# Patient Record
Sex: Male | Born: 1950
Health system: Southern US, Community
[De-identification: ages and names within clinical notes are randomized; demographics above are authoritative.]

## PROBLEM LIST (undated history)

## (undated) DIAGNOSIS — K5090 Crohn's disease, unspecified, without complications: Secondary | ICD-10-CM

## (undated) HISTORY — PX: OTHER SURGICAL HISTORY: SHX169

---

## 2000-01-07 ENCOUNTER — Ambulatory Visit (HOSPITAL_COMMUNITY): Admission: RE | Admit: 2000-01-07 | Discharge: 2000-01-07 | Payer: Self-pay | Admitting: Gastroenterology

## 2000-01-07 ENCOUNTER — Encounter (INDEPENDENT_AMBULATORY_CARE_PROVIDER_SITE_OTHER): Payer: Self-pay | Admitting: Specialist

## 2000-02-04 ENCOUNTER — Encounter (HOSPITAL_COMMUNITY): Admission: RE | Admit: 2000-02-04 | Discharge: 2000-05-04 | Payer: Self-pay | Admitting: Gastroenterology

## 2007-06-05 ENCOUNTER — Ambulatory Visit: Payer: Self-pay | Admitting: Sports Medicine

## 2007-06-05 DIAGNOSIS — S93529A Sprain of metatarsophalangeal joint of unspecified toe(s), initial encounter: Secondary | ICD-10-CM | POA: Insufficient documentation

## 2007-06-05 DIAGNOSIS — M2010 Hallux valgus (acquired), unspecified foot: Secondary | ICD-10-CM | POA: Insufficient documentation

## 2007-06-05 DIAGNOSIS — M76899 Other specified enthesopathies of unspecified lower limb, excluding foot: Secondary | ICD-10-CM | POA: Insufficient documentation

## 2007-06-26 ENCOUNTER — Ambulatory Visit: Payer: Self-pay | Admitting: Sports Medicine

## 2007-06-26 DIAGNOSIS — M2240 Chondromalacia patellae, unspecified knee: Secondary | ICD-10-CM | POA: Insufficient documentation

## 2007-08-08 ENCOUNTER — Ambulatory Visit: Payer: Self-pay | Admitting: Sports Medicine

## 2007-09-12 ENCOUNTER — Ambulatory Visit: Payer: Self-pay | Admitting: Sports Medicine

## 2007-09-12 DIAGNOSIS — B353 Tinea pedis: Secondary | ICD-10-CM

## 2007-09-12 DIAGNOSIS — M659 Synovitis and tenosynovitis, unspecified: Secondary | ICD-10-CM

## 2010-12-03 NOTE — Procedures (Signed)
[facility]  Patient:    Kenneth Cruz, Kenneth Cruz                    MRN: [id number] Proc. Date: 01/07/00 Adm. Date:  [id number] Disc. Date: [id number] Attending:  Louie Bun                           Procedure Report  PROCEDURE:  Colonoscopy.  INDICATIONS FOR PROCEDURE:  Crohns colitis versus ulcerative colitis with failure to improve with prednisone and Rowasa enemas.  The procedure is to reevaluate the extent and severity of disease in the colon and terminal ileum.  DESCRIPTION OF PROCEDURE:  The patient was placed in the left lateral decubitus  position and placed on the pulse monitor with continuous low flow oxygen delivered by nasal cannula.  He was sedated with 15 mg of IV Demerol and 2.5 mg of IV Versed in addition to the 50 mg of Demerol and 5 mg of Versed given for the previous EGD. The Olympus video colonoscope was inserted into the rectum and advanced to the cecum, confirmed by transillumination of McBurneys point and visualization of the ileocecal valve and appendiceal orifice.  The prep was good.  The cecum appeared normal.  The terminal ileum was intubated and explored for several cm and appeared to be normal with the exception of some possible exception of some mild granularity and edema.  Biopsies were taken there.  As mentioned, the cecum appeared normal, as did the ascending and transverse colon.  Within the descending colon beginning t about 50 cm and extending to about 30 cm, there was a confluent colitis characterized by edema, erythema, friability, granularity and some small aphthous ulcers as well as obliteration of the normal vascular pattern.  No pseudopolyps  were seen and no deep ulcers or fissures.  The appearance was consistent with either Crohns or ulcerative colitis.  Biopsies were taken of the most abnormal appearing areas.  There was a transition to normal mucosa from about 30 cm to 20 cm and, below this  point, the mucosa appeared basically normal.  The rectum appeared visibly spared of any inflammation, although there was some hyperemia in the distal rectum.  Biopsies were taken of what appeared to be normal rectal mucosa and sent in a separate specimen container.  There was no evidence of any perianal fistulas or any other evidence of perianal disease.  The colonoscope was then withdrawn nd the patient returned to the recovery room in stable condition.  He tolerated the procedure well and there were no immediate complications.  IMPRESSION:  Colitis, primarily in the descending and sigmoid colon, moderately  intense in appearance.  PLAN:  Await biopsy results from all areas to help tailor therapy and will consider possible use of second-line agents such as Remicade or . DD:  01/07/00 TD:  01/10/00 Job: [id number]

## 2010-12-03 NOTE — Procedures (Signed)
[facility]  Patient:    Kenneth Cruz, Kenneth Cruz                    MRN: [id number] Proc. Date: 01/07/00 Adm. Date:  [id number] Disc. Date: [id number] Attending:  Louie Bun                           Procedure Report  PROCEDURE:  Esophagogastroduodenoscopy with biopsy.  INDICATION FOR PROCEDURE:  History of Crohns colitis fairly refractory to standard medicine with some atypical features. The patient is set up for a colonoscopy to reevaluate the extent and intensity of inflammatory changes of the colon and terminal ileum and we decided to proceed with EGD to rule out any possible secondary sources of diarrhea such as celiac disease.  DESCRIPTION OF PROCEDURE:  The patient was placed in the left lateral decubitus position then placed on the pulse monitor with continue low flow oxygen delivered by nasal cannula. He was sedated with 50 mg IV Demerol and 5 mg IV Versed. The Olympus video endoscope was advanced under direct vision into the oropharynx and esophagus. The esophagus was straight and of normal caliber with the squamocolumnar line at 38 cm. There was no visible hiatal hernia or ring, stricture or other abnormality of the GE junction. The stomach was entered and a small amount of liquid secretions were suctioned from the fundus. Retroflexed view of the cardia was unremarkable. The fundus, body, antrum and pylorus all appeared normal. The duodenum was entered and both the bulb and second portion were well inspected and appeared to be within normal limits. The scope was advanced as far as possible down the distal duodenum and biopsies obtained. The scope was then withdrawn and the patient returned to the recovery room in stable condition. The patient tolerated the procedure well and there were no immediate complications.  IMPRESSION:  Essentially normal endoscopy.  PLAN: 1. Await biopsy results to rule out a sprue-like lesion of the duodenum. 2.  Proceed with colonoscopy. DD:  01/07/00 TD:  01/10/00 Job: [id number]

## 2011-05-27 ENCOUNTER — Emergency Department (HOSPITAL_COMMUNITY)
Admission: EM | Admit: 2011-05-27 | Discharge: 2011-05-27 | Disposition: A | Discharge status: Home / Self Care | Payer: 59 | Attending: Emergency Medicine | Admitting: Emergency Medicine

## 2011-05-27 ENCOUNTER — Encounter: Payer: Self-pay | Admitting: Emergency Medicine

## 2011-05-27 ENCOUNTER — Emergency Department (HOSPITAL_COMMUNITY): Payer: 59

## 2011-05-27 DIAGNOSIS — M549 Dorsalgia, unspecified: Secondary | ICD-10-CM | POA: Insufficient documentation

## 2011-05-27 DIAGNOSIS — N200 Calculus of kidney: Secondary | ICD-10-CM

## 2011-05-27 DIAGNOSIS — R109 Unspecified abdominal pain: Secondary | ICD-10-CM | POA: Insufficient documentation

## 2011-05-27 DIAGNOSIS — Z8719 Personal history of other diseases of the digestive system: Secondary | ICD-10-CM | POA: Insufficient documentation

## 2011-05-27 DIAGNOSIS — N201 Calculus of ureter: Secondary | ICD-10-CM | POA: Insufficient documentation

## 2011-05-27 HISTORY — DX: Crohn's disease, unspecified, without complications: K50.90

## 2011-05-27 LAB — URINE MICROSCOPIC-ADD ON

## 2011-05-27 LAB — COMPREHENSIVE METABOLIC PANEL
ALT: 23 U/L (ref 0–53)
AST: 38 U/L — ABNORMAL HIGH (ref 0–37)
Calcium: 9.8 mg/dL (ref 8.4–10.5)
Creatinine, Ser: 1.26 mg/dL (ref 0.50–1.35)
GFR calc non Af Amer: 60 mL/min — ABNORMAL LOW (ref >90)
Sodium: 138 mEq/L (ref 135–145)
Total Protein: 7.3 g/dL (ref 6.0–8.3)

## 2011-05-27 LAB — URINALYSIS, ROUTINE W REFLEX MICROSCOPIC
Glucose, UA: NEGATIVE mg/dL
Leukocytes, UA: NEGATIVE
Protein, ur: NEGATIVE mg/dL
Specific Gravity, Urine: 1.024 (ref 1.005–1.030)
Urobilinogen, UA: 0.2 mg/dL (ref 0.0–1.0)

## 2011-05-27 LAB — DIFFERENTIAL
Basophils Relative: 0 % (ref 0–1)
Eosinophils Absolute: 0.1 10*3/uL (ref 0.0–0.7)
Monocytes Relative: 12 % (ref 3–12)
Neutrophils Relative %: 50 % (ref 43–77)

## 2011-05-27 LAB — CBC
MCH: 31.1 pg (ref 26.0–34.0)
MCHC: 33.2 g/dL (ref 30.0–36.0)
Platelets: 225 10*3/uL (ref 150–400)

## 2011-05-27 MED ORDER — TAMSULOSIN HCL 0.4 MG PO CAPS: 0.4000 mg | ORAL_CAPSULE | Freq: Every day | ORAL | Status: AC

## 2011-05-27 MED ORDER — HYDROMORPHONE HCL PF 1 MG/ML IJ SOLN
1.0000 mg | Freq: Once | INTRAMUSCULAR | Status: AC
  Administered 2011-05-27: 1 mg via INTRAVENOUS
  Filled 2011-05-27: qty 1

## 2011-05-27 MED ORDER — OXYCODONE-ACETAMINOPHEN 5-325 MG PO TABS: 2.0000 | ORAL_TABLET | ORAL | Status: AC | PRN

## 2011-05-27 MED ORDER — HYDROMORPHONE HCL PF 1 MG/ML IJ SOLN
0.5000 mg | Freq: Once | INTRAMUSCULAR | Status: AC
  Administered 2011-05-27: 0.5 mg via INTRAVENOUS
  Filled 2011-05-27: qty 1

## 2011-05-27 MED ORDER — ONDANSETRON HCL 4 MG/2ML IJ SOLN
4.0000 mg | Freq: Once | INTRAMUSCULAR | Status: AC
  Administered 2011-05-27: 4 mg via INTRAVENOUS
  Filled 2011-05-27: qty 2

## 2011-05-27 MED ORDER — IOHEXOL 300 MG/ML  SOLN
100.0000 mL | Freq: Once | INTRAMUSCULAR | Status: AC | PRN
  Administered 2011-05-27: 100 mL via INTRAVENOUS

## 2011-05-27 NOTE — ED Notes (Signed)
Pt states he woke with pain in his left lower quadrant about 4am  Pt states the pain radiates down into his groin area  Pt denies N/V/D

## 2011-05-27 NOTE — ED Notes (Signed)
Notified MD for pain meds for patient.  Started IV in anticipation of orders.  Labs drawn from IV.  Urine obtained.

## 2011-05-27 NOTE — ED Notes (Signed)
Care of pt assumed. Pt resting in bed. Reports pain is 1/10 at this time. Denies n/v. Pt updated on plan of care and plan for CT. Pt verbalizes understanding and agreement. Will continue to monitor.

## 2011-05-27 NOTE — ED Notes (Signed)
Pt resting. CT contrast finished. Will inform CT. Pt voices no needs/concerns at this time. Will continue to monitor.

## 2011-05-27 NOTE — ED Provider Notes (Signed)
Medical screening examination/treatment/procedure(s) were performed by non-physician practitioner and as supervising physician I was immediately available for consultation/collaboration.  Olivia Mackie, MD 05/27/11 [phone]

## 2011-05-27 NOTE — ED Notes (Signed)
Pt awakened with severe pain.  Pt unable to urinate.  No hx of kidney stone.  Pain in llq of abdomen. Sharp and constant in nature. Pt grimacing with pain.

## 2011-05-27 NOTE — ED Provider Notes (Signed)
History     CSN: [id number] Arrival date & time: 05/27/2011  5:11 AM   First MD Initiated Contact with Patient 05/27/11 [phone]      Chief Complaint  Patient presents with  . Abdominal Pain    (Consider location/radiation/quality/duration/timing/severity/associated sxs/prior treatment) Patient is a 60 y.o. male presenting with abdominal pain. The history is provided by the patient. No language interpreter was used.  Abdominal Pain The primary symptoms of the illness include abdominal pain and dysuria. The primary symptoms of the illness do not include fever, shortness of breath, nausea, vomiting, diarrhea or hematochezia. The current episode started 3 to 5 hours ago. The onset of the illness was sudden. The problem has not changed since onset. Additional symptoms associated with the illness include back pain.   Pt w/ hx of UC (in remission for 5 yrs) is presenting with acute onset of LLQ abd pain since this AM. Pain is sharp, constant, not improving or relieved with anything.  He did notice difficulty urinating this morning. He did have a normal bowel movement this morning.  Pain is now radiating to his left flank. He denies fever, headache, nausea, vomiting, diarrhea, chest pain, shortness of breath, or blood per stool.  Patient states pain does not feels like his ulcerative colitis. Patient denies history of kidney stones. Patient denies history of abdominal hernia. Patient denies rash. He is an avid workout buff and workout every single day. Denies increased strenuous exercising his usual baseline. Past Medical History  Diagnosis Date  . Ulcerative colitis   . Crohn's disease     Past Surgical History  Procedure Date  . Arthroscopic knee     Family History  Problem Relation Age of Onset  . Diabetes Father   . Diabetes Brother     History  Substance Use Topics  . Smoking status: Never Smoker   . Smokeless tobacco: Not on file  . Alcohol Use: No      Review of Systems    Constitutional: Negative for fever.  Respiratory: Negative for shortness of breath.   Gastrointestinal: Positive for abdominal pain. Negative for nausea, vomiting, diarrhea and hematochezia.  Genitourinary: Positive for dysuria.  Musculoskeletal: Positive for back pain.  All other systems reviewed and are negative.    Allergies  Review of patient's allergies indicates no known allergies.  Home Medications   Current Outpatient Rx  Name Route Sig Dispense Refill  . ZOLPIDEM TARTRATE 5 MG PO TABS Oral Take 5 mg by mouth at bedtime as needed.        BP 147/91  Pulse 50  Temp(Src) 98.7 F (37.1 C) (Oral)  Resp 18  SpO2 100%  Physical Exam  Constitutional: He appears well-developed and well-nourished.  HENT:  Head: Normocephalic and atraumatic.  Eyes: Conjunctivae and EOM are normal.  Neck: Normal range of motion. Neck supple.  Cardiovascular: Normal rate, regular rhythm and normal heart sounds.  Exam reveals no gallop and no friction rub.   No murmur heard. Pulmonary/Chest: Effort normal and breath sounds normal. No respiratory distress. He has no wheezes.       L CVA tenderness  Abdominal: Soft. He exhibits no distension. There is no tenderness. There is CVA tenderness. There is no rebound and no guarding. No hernia. Hernia confirmed negative in the right inguinal area and confirmed negative in the left inguinal area.       LLQ nontender  Genitourinary: Penis normal. Guaiac negative stool. No penile tenderness.       No  hernia present  Musculoskeletal: Normal range of motion.  Skin: Skin is warm and dry. No rash noted. He is not diaphoretic.    ED Course  Procedures (including critical care time)   Labs Reviewed  CBC  DIFFERENTIAL  COMPREHENSIVE METABOLIC PANEL  LIPASE, BLOOD  URINALYSIS, ROUTINE W REFLEX MICROSCOPIC   No results found. Labs Reviewed  COMPREHENSIVE METABOLIC PANEL - Abnormal; Notable for the following:    Glucose, Bld 127 (*)    AST 38 (*)     GFR calc non Af Amer 60 (*)    GFR calc Af Amer 70 (*)    All other components within normal limits  URINALYSIS, ROUTINE W REFLEX MICROSCOPIC - Abnormal; Notable for the following:    Hgb urine dipstick SMALL (*)    All other components within normal limits  URINE MICROSCOPIC-ADD ON - Abnormal; Notable for the following:    Bacteria, UA FEW (*)    All other components within normal limits  CBC  DIFFERENTIAL  LIPASE, BLOOD  OCCULT BLOOD, POC DEVICE   Results for orders placed during the hospital encounter of 05/27/11  CBC      Component Value Range   WBC 6.6  4.0 - 10.5 (K/uL)   RBC 4.86  4.22 - 5.81 (MIL/uL)   Hemoglobin 15.1  13.0 - 17.0 (g/dL)   HCT 16.1  09.6 - 04.5 (%)   MCV 93.6  78.0 - 100.0 (fL)   MCH 31.1  26.0 - 34.0 (pg)   MCHC 33.2  30.0 - 36.0 (g/dL)   RDW 40.9  81.1 - 91.4 (%)   Platelets 225  150 - 400 (K/uL)  DIFFERENTIAL      Component Value Range   Neutrophils Relative 50  43 - 77 (%)   Neutro Abs 3.3  1.7 - 7.7 (K/uL)   Lymphocytes Relative 36  12 - 46 (%)   Lymphs Abs 2.4  0.7 - 4.0 (K/uL)   Monocytes Relative 12  3 - 12 (%)   Monocytes Absolute 0.8  0.1 - 1.0 (K/uL)   Eosinophils Relative 2  0 - 5 (%)   Eosinophils Absolute 0.1  0.0 - 0.7 (K/uL)   Basophils Relative 0  0 - 1 (%)   Basophils Absolute 0.0  0.0 - 0.1 (K/uL)  COMPREHENSIVE METABOLIC PANEL      Component Value Range   Sodium 138  135 - 145 (mEq/L)   Potassium 3.8  3.5 - 5.1 (mEq/L)   Chloride 100  96 - 112 (mEq/L)   CO2 29  19 - 32 (mEq/L)   Glucose, Bld 127 (*) 70 - 99 (mg/dL)   BUN 22  6 - 23 (mg/dL)   Creatinine, Ser 7.82  0.50 - 1.35 (mg/dL)   Calcium 9.8  8.4 - 95.6 (mg/dL)   Total Protein 7.3  6.0 - 8.3 (g/dL)   Albumin 3.8  3.5 - 5.2 (g/dL)   AST 38 (*) 0 - 37 (U/L)   ALT 23  0 - 53 (U/L)   Alkaline Phosphatase 85  39 - 117 (U/L)   Total Bilirubin 0.5  0.3 - 1.2 (mg/dL)   GFR calc non Af Amer 60 (*) >90 (mL/min)   GFR calc Af Amer 70 (*) >90 (mL/min)  LIPASE, BLOOD       Component Value Range   Lipase 13  11 - 59 (U/L)  URINALYSIS, ROUTINE W REFLEX MICROSCOPIC      Component Value Range   Color, Urine YELLOW  YELLOW  Appearance CLEAR  CLEAR    Specific Gravity, Urine 1.024  1.005 - 1.030    pH 5.5  5.0 - 8.0    Glucose, UA NEGATIVE  NEGATIVE (mg/dL)   Hgb urine dipstick SMALL (*) NEGATIVE    Bilirubin Urine NEGATIVE  NEGATIVE    Ketones, ur NEGATIVE  NEGATIVE (mg/dL)   Protein, ur NEGATIVE  NEGATIVE (mg/dL)   Urobilinogen, UA 0.2  0.0 - 1.0 (mg/dL)   Nitrite NEGATIVE  NEGATIVE    Leukocytes, UA NEGATIVE  NEGATIVE   URINE MICROSCOPIC-ADD ON      Component Value Range   RBC / HPF 0-2  <3 (RBC/hpf)   Bacteria, UA FEW (*) RARE    Urine-Other MUCOUS PRESENT    OCCULT BLOOD, POC DEVICE      Component Value Range   Fecal Occult Bld NEGATIVE     Ct Abdomen Pelvis W Contrast  05/27/2011  *RADIOLOGY REPORT*  Clinical Data: Left lower quadrant pain.  Left flank pain.  CT ABDOMEN AND PELVIS WITH CONTRAST  Technique:  Multidetector CT imaging of the abdomen and pelvis was performed following the standard protocol during bolus administration of intravenous contrast.  Contrast: OMNIPAQUE IOHEXOL 300 MG/ML IV SOLN  Comparison: None.  Findings: 5.4 mm distal left ureteral (just proximal to the left ureteral vesicle junction) obstructing stone with moderate left- sided hydronephrosis with delayed function left kidney.  Tiny nonobstructing left lower pole renal calculi.  Mild atelectasis lung bases.  Prominence of the inferior vena cava and iliac/femoral veins may be related to increased right heart pressure versus Valsalva performed during imaging.  No focal liver, splenic, pancreatic, renal or adrenal lesion.  No abdominal aortic aneurysm.  Slightly prominent prostate gland and seminal vesicles.  Clinical and laboratory correlation recommended.  Noncontrast filled views of the urinary bladder unremarkable.  Scattered colonic diverticula without inflammation.  No  free fluid or free intraperitoneal air.  No calcified gallstones.  Cecum extends towards the superior aspect of the inguinal canal. No obstruction.  No bony destructive lesion.  There is a change in caliber of the stomach at the distal body level which may be related to fold/peristalsis rather than mass.  Scattered normal sized mesenteric lymph nodes.  IMPRESSION: 5.4 mm distal left ureteral (just proximal to the left ureteral vesicle junction) obstructing stone with moderate left-sided hydronephrosis with delayed function left kidney.  Tiny nonobstructing left lower pole renal calculi.  Please see above.  Original Report Authenticated By: Fuller Canada, M.D.      No diagnosis found.  6:37 AM Patient's present symptoms is suggestive of kidney stone. Obtain UA for further evaluation. If UA is normal, I will consider doing an abdominal and pelvis CT scan with contrast.  MDM  6:59 AM UA only shows minimal RBC.  Therefore, an abdominal and pelvic CT scan with contrast to be ordered for further evaluation.  Patient's pain is improved with Dilaudid. His vital signs are stable. His post and the 46s but suspects is due to his active lifestyle, and staying fit.     10:57 AM Abdominal CT scan today shows a 5.4 mm stone in the left UVJ with hydronephrosis. We'll prescribe pain medication and Flomax and also followup with a urologist. The patient agreed with plan. His vital signs stable. He is afebrile and his UA shows no sign of infection.         Fayrene Helper, PA 05/27/11 1105

## 2013-09-02 ENCOUNTER — Other Ambulatory Visit: Payer: Self-pay | Admitting: Orthopedic Surgery

## 2013-09-02 DIAGNOSIS — M545 Low back pain, unspecified: Secondary | ICD-10-CM

## 2013-09-07 ENCOUNTER — Other Ambulatory Visit: Payer: 59

## 2013-12-06 ENCOUNTER — Emergency Department (HOSPITAL_COMMUNITY)
Admission: EM | Admit: 2013-12-06 | Discharge: 2013-12-06 | Disposition: A | Discharge status: Home / Self Care | Payer: 59 | Source: Home / Self Care | Attending: Emergency Medicine | Admitting: Emergency Medicine

## 2013-12-06 ENCOUNTER — Encounter (HOSPITAL_COMMUNITY): Payer: Self-pay | Admitting: Emergency Medicine

## 2013-12-06 DIAGNOSIS — Z862 Personal history of diseases of the blood and blood-forming organs and certain disorders involving the immune mechanism: Secondary | ICD-10-CM

## 2013-12-06 DIAGNOSIS — Z8639 Personal history of other endocrine, nutritional and metabolic disease: Secondary | ICD-10-CM

## 2013-12-06 DIAGNOSIS — M791 Myalgia, unspecified site: Secondary | ICD-10-CM

## 2013-12-06 DIAGNOSIS — IMO0001 Reserved for inherently not codable concepts without codable children: Secondary | ICD-10-CM

## 2013-12-06 LAB — POCT I-STAT, CHEM 8
BUN: 24 mg/dL — ABNORMAL HIGH (ref 6–23)
CHLORIDE: 100 meq/L (ref 96–112)
Calcium, Ion: 1.23 mmol/L (ref 1.13–1.30)
Creatinine, Ser: 1.2 mg/dL (ref 0.50–1.35)
GLUCOSE: 93 mg/dL (ref 70–99)
HEMATOCRIT: 48 % (ref 39.0–52.0)
Hemoglobin: 16.3 g/dL (ref 13.0–17.0)
POTASSIUM: 4.3 meq/L (ref 3.7–5.3)
Sodium: 141 mEq/L (ref 137–147)
TCO2: 28 mmol/L (ref 0–100)

## 2013-12-06 NOTE — Discharge Instructions (Signed)
° °  Nice to meet you today. Hopefully the achiness will resolve, if worsens of further complaints please follow up with [place] or your PCP. Take care.

## 2013-12-06 NOTE — ED Notes (Signed)
Pt reports he rec'd call from PCP yest eve in re: to hight potasium; 5.5 Was told to come in to an Urgent care and have it checked; poss ekg Alert w/no signs of acute distress.

## 2013-12-06 NOTE — ED Provider Notes (Signed)
CSN: [id number]     Arrival date & time 12/06/13  1614 History   First MD Initiated Contact with Patient 12/06/13 1658     Chief Complaint  Patient presents with  . Labs Only    high potassium   (Consider location/radiation/quality/duration/timing/severity/associated sxs/prior Treatment) HPI Comments: Patient presents under the advice of a Physician at [facility] to have his Potassium levels checked. On 05/15 his K was reported at 5.5. He got the message today and came for a recheck because he has been having achiness in both arms. He has also been taking several new supplements. Earlier today he ran and lifted weights without any symptoms at all. He denies chest pain, SOB, or weakness.   The history is provided by the patient.    Past Medical History  Diagnosis Date  . Ulcerative colitis   . Crohn's disease    Past Surgical History  Procedure Laterality Date  . Arthroscopic knee     Family History  Problem Relation Age of Onset  . Diabetes Father   . Diabetes Brother    History  Substance Use Topics  . Smoking status: Never Smoker   . Smokeless tobacco: Not on file  . Alcohol Use: No    Review of Systems  Constitutional: Negative for chills and fatigue.  Respiratory: Negative.   Cardiovascular: Negative.   Endocrine: Negative.   Musculoskeletal: Positive for myalgias.  Skin: Negative.   Neurological: Negative.   Psychiatric/Behavioral: Negative.     Allergies  Review of patient's allergies indicates no known allergies.  Home Medications   Prior to Admission medications   Medication Sig Start Date End Date Taking? Authorizing Provider  Tamsulosin HCl (FLOMAX) 0.4 MG CAPS Take 1 capsule (0.4 mg total) by mouth daily after breakfast. 05/27/11   Benny Lennert, MD  zolpidem (AMBIEN) 5 MG tablet Take 5 mg by mouth at bedtime as needed.      Historical Provider, MD   BP 142/77  Pulse 98  Temp(Src) 98.4 F (36.9 C) (Oral)  Resp 18  SpO2 99% Physical  Exam  Nursing note and vitals reviewed. Constitutional: He is oriented to person, place, and time. He appears well-developed and well-nourished. No distress.  HENT:  Head: Normocephalic and atraumatic.  Neck: Normal range of motion.  Cardiovascular: Normal rate, regular rhythm and normal heart sounds.   No murmur heard. Pulmonary/Chest: Effort normal and breath sounds normal. No respiratory distress. He has no wheezes. He has no rales.  Musculoskeletal: Normal range of motion.  Full painless rom in the neck and shoulders without reproduction of pain. Motor 5/5 in bilateral upper extremities  Neurological: He is alert and oriented to person, place, and time. No cranial nerve deficit. He exhibits normal muscle tone. Coordination normal.  Skin: Skin is warm and dry. He is not diaphoretic.  Psychiatric: His behavior is normal.    ED Course  EKG  Date/Time: 12/06/2013 5:58 PM Performed by: Riki Sheer Authorized by: Riki Sheer Interpreted by ED physician Comments: Over read by Dr. Lorenz Coaster. Bradycardia in setting of past runner and baseline. Early repolarization o/w normal reading.     (including critical care time) Labs Review Labs Reviewed  POCT I-STAT, CHEM 8 - Abnormal; Notable for the following:    BUN 24 (*)    All other components within normal limits    Imaging Review No results found.   MDM   1. H/O hyperkalemia   2. Myalgia    Resolved. Asymptomatic o/t mild  achiness in the shoulders-EKG normal. On several natural supplements. Suggest holding to see if helps the achiness. No signs of weakness or findings in M/S exam. Reassurance given.     Riki SheerMichelle G Ciani Rutten, PA-C 12/06/13 1756  Riki SheerMichelle G Darnelle Derrick, PA-C 12/06/13 1759

## 2013-12-08 NOTE — ED Provider Notes (Signed)
Medical screening examination/treatment/procedure(s) were performed by non-physician practitioner and as supervising physician I was immediately available for consultation/collaboration.  Leslee Home, M.D.  Reuben Likes, MD 12/08/13 [phone]

## 2014-12-11 ENCOUNTER — Ambulatory Visit (INDEPENDENT_AMBULATORY_CARE_PROVIDER_SITE_OTHER): Payer: 59 | Admitting: Podiatry

## 2014-12-11 ENCOUNTER — Ambulatory Visit (INDEPENDENT_AMBULATORY_CARE_PROVIDER_SITE_OTHER): Payer: 59

## 2014-12-11 DIAGNOSIS — M779 Enthesopathy, unspecified: Secondary | ICD-10-CM

## 2014-12-11 NOTE — Progress Notes (Signed)
   Subjective:    Patient ID: Kenneth Cruz, male    DOB: Oct 27, 1950, 64 y.o.   MRN: [id number]  HPI  Pt presents with left foot pain between 2&3 met on the plantar side, he states that the pain worsens when "pushing off" to walk and with prolonged walking, pain has worsened over past year  Review of Systems  All other systems reviewed and are negative.      Objective:   Physical Exam: I have reviewed his past mental history medications allergies surgery social history and review of systems. Pulses are palpable bilateral. Neurologic sensorium is intact for Semmes-Weinstein monofilament. Deep tendon reflexes are intact bilaterally muscle strength is 5 over 5 dorsiflexion plantar plantar flexors and inverters and everters. Orthopedic evaluation demonstrates pain on range of motion and palpation of the third metatarsophalangeal joint of the left foot. He has a mild bulbous click to the third interdigital space that is not tender on palpation. Radiograph confirms relatively normal osseous architecture.        Assessment & Plan:  Assessment: Capsulitis third metatarsophalangeal joint left foot also concerned for a neuroma in this area.  Plan: Injected around the joint today third metatarsophalangeal joint left. Discussed appropriate shoe gear stretching exercises ice therapies your modifications.

## 2015-01-01 ENCOUNTER — Ambulatory Visit: Payer: 59 | Admitting: Podiatry

## 2016-02-22 DIAGNOSIS — J301 Allergic rhinitis due to pollen: Secondary | ICD-10-CM | POA: Diagnosis not present

## 2016-02-22 DIAGNOSIS — J3089 Other allergic rhinitis: Secondary | ICD-10-CM | POA: Diagnosis not present

## 2016-02-26 DIAGNOSIS — M5116 Intervertebral disc disorders with radiculopathy, lumbar region: Secondary | ICD-10-CM | POA: Diagnosis not present

## 2016-02-26 DIAGNOSIS — M545 Low back pain: Secondary | ICD-10-CM | POA: Diagnosis not present

## 2016-02-29 DIAGNOSIS — J3089 Other allergic rhinitis: Secondary | ICD-10-CM | POA: Diagnosis not present

## 2016-02-29 DIAGNOSIS — J301 Allergic rhinitis due to pollen: Secondary | ICD-10-CM | POA: Diagnosis not present

## 2016-03-07 DIAGNOSIS — J301 Allergic rhinitis due to pollen: Secondary | ICD-10-CM | POA: Diagnosis not present

## 2016-03-07 DIAGNOSIS — J3089 Other allergic rhinitis: Secondary | ICD-10-CM | POA: Diagnosis not present

## 2016-03-14 DIAGNOSIS — J3089 Other allergic rhinitis: Secondary | ICD-10-CM | POA: Diagnosis not present

## 2016-03-14 DIAGNOSIS — J301 Allergic rhinitis due to pollen: Secondary | ICD-10-CM | POA: Diagnosis not present

## 2016-03-22 DIAGNOSIS — J3089 Other allergic rhinitis: Secondary | ICD-10-CM | POA: Diagnosis not present

## 2016-03-22 DIAGNOSIS — J301 Allergic rhinitis due to pollen: Secondary | ICD-10-CM | POA: Diagnosis not present

## 2016-03-28 DIAGNOSIS — J301 Allergic rhinitis due to pollen: Secondary | ICD-10-CM | POA: Diagnosis not present

## 2016-03-28 DIAGNOSIS — J3089 Other allergic rhinitis: Secondary | ICD-10-CM | POA: Diagnosis not present

## 2016-04-05 DIAGNOSIS — J301 Allergic rhinitis due to pollen: Secondary | ICD-10-CM | POA: Diagnosis not present

## 2016-04-05 DIAGNOSIS — J3089 Other allergic rhinitis: Secondary | ICD-10-CM | POA: Diagnosis not present

## 2016-04-11 DIAGNOSIS — J301 Allergic rhinitis due to pollen: Secondary | ICD-10-CM | POA: Diagnosis not present

## 2016-04-11 DIAGNOSIS — J3089 Other allergic rhinitis: Secondary | ICD-10-CM | POA: Diagnosis not present

## 2016-04-14 DIAGNOSIS — J301 Allergic rhinitis due to pollen: Secondary | ICD-10-CM | POA: Diagnosis not present

## 2016-04-14 DIAGNOSIS — J3081 Allergic rhinitis due to animal (cat) (dog) hair and dander: Secondary | ICD-10-CM | POA: Diagnosis not present

## 2016-04-14 DIAGNOSIS — J3089 Other allergic rhinitis: Secondary | ICD-10-CM | POA: Diagnosis not present

## 2016-04-18 DIAGNOSIS — J301 Allergic rhinitis due to pollen: Secondary | ICD-10-CM | POA: Diagnosis not present

## 2016-04-18 DIAGNOSIS — J3089 Other allergic rhinitis: Secondary | ICD-10-CM | POA: Diagnosis not present

## 2016-04-25 DIAGNOSIS — J301 Allergic rhinitis due to pollen: Secondary | ICD-10-CM | POA: Diagnosis not present

## 2016-04-25 DIAGNOSIS — J3089 Other allergic rhinitis: Secondary | ICD-10-CM | POA: Diagnosis not present

## 2016-04-26 ENCOUNTER — Encounter: Payer: Self-pay | Admitting: Podiatry

## 2016-04-26 ENCOUNTER — Ambulatory Visit (INDEPENDENT_AMBULATORY_CARE_PROVIDER_SITE_OTHER): Payer: Medicare Other | Admitting: Podiatry

## 2016-04-26 DIAGNOSIS — M779 Enthesopathy, unspecified: Secondary | ICD-10-CM

## 2016-04-27 NOTE — Progress Notes (Signed)
He presents today after having injured his back. Severe. States that he still has that neuroma capsulitis type symptoms and the left which seems to have been worsened by his back. He states the foot still bothers me to some degree and he would like to see by getting some insoles or his athletic shoes.  Objective: Vital signs are stable he is alert and oriented 3. Pulses are palpable. Rectus foot type with some tenderness on palpation of the second and third metatarsophalangeal joints with no pain on palpation of the neuroma today. No open lesions or wounds. Symptoms appear to be muted compared to previous evaluation.  Assessment: Forefoot symptoms with a history of plantar fasciitis and back pain.  Plan: He was scanned today percent of orthotics.

## 2016-05-02 DIAGNOSIS — J301 Allergic rhinitis due to pollen: Secondary | ICD-10-CM | POA: Diagnosis not present

## 2016-05-02 DIAGNOSIS — J3089 Other allergic rhinitis: Secondary | ICD-10-CM | POA: Diagnosis not present

## 2016-05-09 DIAGNOSIS — J301 Allergic rhinitis due to pollen: Secondary | ICD-10-CM | POA: Diagnosis not present

## 2016-05-09 DIAGNOSIS — J3089 Other allergic rhinitis: Secondary | ICD-10-CM | POA: Diagnosis not present

## 2016-05-11 DIAGNOSIS — J301 Allergic rhinitis due to pollen: Secondary | ICD-10-CM | POA: Diagnosis not present

## 2016-05-11 DIAGNOSIS — J3089 Other allergic rhinitis: Secondary | ICD-10-CM | POA: Diagnosis not present

## 2016-05-16 DIAGNOSIS — J3089 Other allergic rhinitis: Secondary | ICD-10-CM | POA: Diagnosis not present

## 2016-05-16 DIAGNOSIS — J301 Allergic rhinitis due to pollen: Secondary | ICD-10-CM | POA: Diagnosis not present

## 2016-05-17 ENCOUNTER — Ambulatory Visit: Payer: 59

## 2016-05-17 DIAGNOSIS — M779 Enthesopathy, unspecified: Secondary | ICD-10-CM

## 2016-05-17 NOTE — Patient Instructions (Signed)

## 2016-05-17 NOTE — Progress Notes (Signed)
Patient presents for orthotic pick up.  Verbal and written break in and wear instructions given.  Patient will follow up in 4 weeks if symptoms worsen or fail to improve. 

## 2016-05-18 DIAGNOSIS — M7742 Metatarsalgia, left foot: Secondary | ICD-10-CM | POA: Diagnosis not present

## 2016-05-18 DIAGNOSIS — M21612 Bunion of left foot: Secondary | ICD-10-CM | POA: Diagnosis not present

## 2016-05-18 DIAGNOSIS — M2042 Other hammer toe(s) (acquired), left foot: Secondary | ICD-10-CM | POA: Diagnosis not present

## 2016-05-19 DIAGNOSIS — J3089 Other allergic rhinitis: Secondary | ICD-10-CM | POA: Diagnosis not present

## 2016-05-19 DIAGNOSIS — J301 Allergic rhinitis due to pollen: Secondary | ICD-10-CM | POA: Diagnosis not present

## 2016-05-23 DIAGNOSIS — J301 Allergic rhinitis due to pollen: Secondary | ICD-10-CM | POA: Diagnosis not present

## 2016-05-23 DIAGNOSIS — J3089 Other allergic rhinitis: Secondary | ICD-10-CM | POA: Diagnosis not present

## 2016-06-02 DIAGNOSIS — J3089 Other allergic rhinitis: Secondary | ICD-10-CM | POA: Diagnosis not present

## 2016-06-02 DIAGNOSIS — J301 Allergic rhinitis due to pollen: Secondary | ICD-10-CM | POA: Diagnosis not present

## 2016-06-06 DIAGNOSIS — J3089 Other allergic rhinitis: Secondary | ICD-10-CM | POA: Diagnosis not present

## 2016-06-06 DIAGNOSIS — J301 Allergic rhinitis due to pollen: Secondary | ICD-10-CM | POA: Diagnosis not present

## 2016-06-13 DIAGNOSIS — J3089 Other allergic rhinitis: Secondary | ICD-10-CM | POA: Diagnosis not present

## 2016-06-13 DIAGNOSIS — J3081 Allergic rhinitis due to animal (cat) (dog) hair and dander: Secondary | ICD-10-CM | POA: Diagnosis not present

## 2016-06-13 DIAGNOSIS — J301 Allergic rhinitis due to pollen: Secondary | ICD-10-CM | POA: Diagnosis not present

## 2016-06-17 DIAGNOSIS — Z86010 Personal history of colonic polyps: Secondary | ICD-10-CM | POA: Diagnosis not present

## 2016-06-20 DIAGNOSIS — J3089 Other allergic rhinitis: Secondary | ICD-10-CM | POA: Diagnosis not present

## 2016-06-20 DIAGNOSIS — J301 Allergic rhinitis due to pollen: Secondary | ICD-10-CM | POA: Diagnosis not present

## 2016-06-21 DIAGNOSIS — M2042 Other hammer toe(s) (acquired), left foot: Secondary | ICD-10-CM | POA: Diagnosis not present

## 2016-06-21 DIAGNOSIS — M2012 Hallux valgus (acquired), left foot: Secondary | ICD-10-CM | POA: Diagnosis not present

## 2016-06-21 DIAGNOSIS — M7742 Metatarsalgia, left foot: Secondary | ICD-10-CM | POA: Diagnosis not present

## 2016-06-21 DIAGNOSIS — G8918 Other acute postprocedural pain: Secondary | ICD-10-CM | POA: Diagnosis not present

## 2016-06-21 DIAGNOSIS — M21612 Bunion of left foot: Secondary | ICD-10-CM | POA: Diagnosis not present

## 2016-06-26 DIAGNOSIS — B029 Zoster without complications: Secondary | ICD-10-CM | POA: Diagnosis not present

## 2016-06-27 DIAGNOSIS — J3089 Other allergic rhinitis: Secondary | ICD-10-CM | POA: Diagnosis not present

## 2016-06-27 DIAGNOSIS — J301 Allergic rhinitis due to pollen: Secondary | ICD-10-CM | POA: Diagnosis not present

## 2016-06-30 DIAGNOSIS — N200 Calculus of kidney: Secondary | ICD-10-CM | POA: Diagnosis not present

## 2016-06-30 DIAGNOSIS — B029 Zoster without complications: Secondary | ICD-10-CM | POA: Diagnosis not present

## 2016-06-30 DIAGNOSIS — H9201 Otalgia, right ear: Secondary | ICD-10-CM | POA: Diagnosis not present

## 2016-06-30 DIAGNOSIS — R1030 Lower abdominal pain, unspecified: Secondary | ICD-10-CM | POA: Diagnosis not present

## 2016-07-04 DIAGNOSIS — J3089 Other allergic rhinitis: Secondary | ICD-10-CM | POA: Diagnosis not present

## 2016-07-04 DIAGNOSIS — Z4789 Encounter for other orthopedic aftercare: Secondary | ICD-10-CM | POA: Diagnosis not present

## 2016-07-04 DIAGNOSIS — J301 Allergic rhinitis due to pollen: Secondary | ICD-10-CM | POA: Diagnosis not present

## 2016-07-12 DIAGNOSIS — J301 Allergic rhinitis due to pollen: Secondary | ICD-10-CM | POA: Diagnosis not present

## 2016-07-12 DIAGNOSIS — J3089 Other allergic rhinitis: Secondary | ICD-10-CM | POA: Diagnosis not present

## 2016-07-15 DIAGNOSIS — H524 Presbyopia: Secondary | ICD-10-CM | POA: Diagnosis not present

## 2016-07-15 DIAGNOSIS — H2513 Age-related nuclear cataract, bilateral: Secondary | ICD-10-CM | POA: Diagnosis not present

## 2016-07-19 DIAGNOSIS — J3089 Other allergic rhinitis: Secondary | ICD-10-CM | POA: Diagnosis not present

## 2016-07-19 DIAGNOSIS — J301 Allergic rhinitis due to pollen: Secondary | ICD-10-CM | POA: Diagnosis not present

## 2016-07-25 DIAGNOSIS — J301 Allergic rhinitis due to pollen: Secondary | ICD-10-CM | POA: Diagnosis not present

## 2016-07-25 DIAGNOSIS — J3089 Other allergic rhinitis: Secondary | ICD-10-CM | POA: Diagnosis not present

## 2016-07-27 DIAGNOSIS — Z0000 Encounter for general adult medical examination without abnormal findings: Secondary | ICD-10-CM | POA: Diagnosis not present

## 2016-07-27 DIAGNOSIS — I10 Essential (primary) hypertension: Secondary | ICD-10-CM | POA: Diagnosis not present

## 2016-07-27 DIAGNOSIS — Z125 Encounter for screening for malignant neoplasm of prostate: Secondary | ICD-10-CM | POA: Diagnosis not present

## 2016-07-27 DIAGNOSIS — G4700 Insomnia, unspecified: Secondary | ICD-10-CM | POA: Diagnosis not present

## 2016-08-05 DIAGNOSIS — M21612 Bunion of left foot: Secondary | ICD-10-CM | POA: Diagnosis not present

## 2016-08-05 DIAGNOSIS — J301 Allergic rhinitis due to pollen: Secondary | ICD-10-CM | POA: Diagnosis not present

## 2016-08-05 DIAGNOSIS — J3089 Other allergic rhinitis: Secondary | ICD-10-CM | POA: Diagnosis not present

## 2016-08-05 DIAGNOSIS — Z4789 Encounter for other orthopedic aftercare: Secondary | ICD-10-CM | POA: Diagnosis not present

## 2016-08-08 DIAGNOSIS — Z23 Encounter for immunization: Secondary | ICD-10-CM | POA: Diagnosis not present

## 2016-08-08 DIAGNOSIS — Z0000 Encounter for general adult medical examination without abnormal findings: Secondary | ICD-10-CM | POA: Diagnosis not present

## 2016-08-08 DIAGNOSIS — I10 Essential (primary) hypertension: Secondary | ICD-10-CM | POA: Diagnosis not present

## 2016-08-11 DIAGNOSIS — J3089 Other allergic rhinitis: Secondary | ICD-10-CM | POA: Diagnosis not present

## 2016-08-11 DIAGNOSIS — J301 Allergic rhinitis due to pollen: Secondary | ICD-10-CM | POA: Diagnosis not present

## 2016-08-15 DIAGNOSIS — J3089 Other allergic rhinitis: Secondary | ICD-10-CM | POA: Diagnosis not present

## 2016-08-15 DIAGNOSIS — J301 Allergic rhinitis due to pollen: Secondary | ICD-10-CM | POA: Diagnosis not present

## 2016-08-22 DIAGNOSIS — J3089 Other allergic rhinitis: Secondary | ICD-10-CM | POA: Diagnosis not present

## 2016-08-22 DIAGNOSIS — J301 Allergic rhinitis due to pollen: Secondary | ICD-10-CM | POA: Diagnosis not present

## 2016-08-23 DIAGNOSIS — J301 Allergic rhinitis due to pollen: Secondary | ICD-10-CM | POA: Diagnosis not present

## 2016-08-23 DIAGNOSIS — J3089 Other allergic rhinitis: Secondary | ICD-10-CM | POA: Diagnosis not present

## 2016-08-29 DIAGNOSIS — J301 Allergic rhinitis due to pollen: Secondary | ICD-10-CM | POA: Diagnosis not present

## 2016-08-29 DIAGNOSIS — J3089 Other allergic rhinitis: Secondary | ICD-10-CM | POA: Diagnosis not present

## 2016-09-05 DIAGNOSIS — J301 Allergic rhinitis due to pollen: Secondary | ICD-10-CM | POA: Diagnosis not present

## 2016-09-05 DIAGNOSIS — J3089 Other allergic rhinitis: Secondary | ICD-10-CM | POA: Diagnosis not present

## 2016-09-09 DIAGNOSIS — M21612 Bunion of left foot: Secondary | ICD-10-CM | POA: Diagnosis not present

## 2016-09-09 DIAGNOSIS — Z4789 Encounter for other orthopedic aftercare: Secondary | ICD-10-CM | POA: Diagnosis not present

## 2016-09-12 DIAGNOSIS — J3089 Other allergic rhinitis: Secondary | ICD-10-CM | POA: Diagnosis not present

## 2016-09-12 DIAGNOSIS — J301 Allergic rhinitis due to pollen: Secondary | ICD-10-CM | POA: Diagnosis not present

## 2016-09-19 DIAGNOSIS — J3089 Other allergic rhinitis: Secondary | ICD-10-CM | POA: Diagnosis not present

## 2016-09-19 DIAGNOSIS — J301 Allergic rhinitis due to pollen: Secondary | ICD-10-CM | POA: Diagnosis not present

## 2016-09-27 DIAGNOSIS — J3089 Other allergic rhinitis: Secondary | ICD-10-CM | POA: Diagnosis not present

## 2016-09-27 DIAGNOSIS — J301 Allergic rhinitis due to pollen: Secondary | ICD-10-CM | POA: Diagnosis not present

## 2016-09-30 DIAGNOSIS — H903 Sensorineural hearing loss, bilateral: Secondary | ICD-10-CM | POA: Diagnosis not present

## 2016-10-03 DIAGNOSIS — J3089 Other allergic rhinitis: Secondary | ICD-10-CM | POA: Diagnosis not present

## 2016-10-03 DIAGNOSIS — J301 Allergic rhinitis due to pollen: Secondary | ICD-10-CM | POA: Diagnosis not present

## 2016-10-06 DIAGNOSIS — J3089 Other allergic rhinitis: Secondary | ICD-10-CM | POA: Diagnosis not present

## 2016-10-06 DIAGNOSIS — J301 Allergic rhinitis due to pollen: Secondary | ICD-10-CM | POA: Diagnosis not present

## 2016-10-10 DIAGNOSIS — J301 Allergic rhinitis due to pollen: Secondary | ICD-10-CM | POA: Diagnosis not present

## 2016-10-10 DIAGNOSIS — J3089 Other allergic rhinitis: Secondary | ICD-10-CM | POA: Diagnosis not present

## 2016-10-17 DIAGNOSIS — J301 Allergic rhinitis due to pollen: Secondary | ICD-10-CM | POA: Diagnosis not present

## 2016-10-17 DIAGNOSIS — J3089 Other allergic rhinitis: Secondary | ICD-10-CM | POA: Diagnosis not present

## 2016-10-19 DIAGNOSIS — J301 Allergic rhinitis due to pollen: Secondary | ICD-10-CM | POA: Diagnosis not present

## 2016-10-19 DIAGNOSIS — J3089 Other allergic rhinitis: Secondary | ICD-10-CM | POA: Diagnosis not present

## 2016-10-24 DIAGNOSIS — J3089 Other allergic rhinitis: Secondary | ICD-10-CM | POA: Diagnosis not present

## 2016-10-24 DIAGNOSIS — J301 Allergic rhinitis due to pollen: Secondary | ICD-10-CM | POA: Diagnosis not present

## 2016-10-31 DIAGNOSIS — J301 Allergic rhinitis due to pollen: Secondary | ICD-10-CM | POA: Diagnosis not present

## 2016-10-31 DIAGNOSIS — J3089 Other allergic rhinitis: Secondary | ICD-10-CM | POA: Diagnosis not present

## 2016-11-07 DIAGNOSIS — J301 Allergic rhinitis due to pollen: Secondary | ICD-10-CM | POA: Diagnosis not present

## 2016-11-07 DIAGNOSIS — J3089 Other allergic rhinitis: Secondary | ICD-10-CM | POA: Diagnosis not present

## 2016-11-14 DIAGNOSIS — J3089 Other allergic rhinitis: Secondary | ICD-10-CM | POA: Diagnosis not present

## 2016-11-14 DIAGNOSIS — J301 Allergic rhinitis due to pollen: Secondary | ICD-10-CM | POA: Diagnosis not present

## 2016-11-21 DIAGNOSIS — J301 Allergic rhinitis due to pollen: Secondary | ICD-10-CM | POA: Diagnosis not present

## 2016-11-21 DIAGNOSIS — J3089 Other allergic rhinitis: Secondary | ICD-10-CM | POA: Diagnosis not present

## 2016-11-25 DIAGNOSIS — H903 Sensorineural hearing loss, bilateral: Secondary | ICD-10-CM | POA: Diagnosis not present

## 2016-11-28 DIAGNOSIS — J301 Allergic rhinitis due to pollen: Secondary | ICD-10-CM | POA: Diagnosis not present

## 2016-11-28 DIAGNOSIS — J3089 Other allergic rhinitis: Secondary | ICD-10-CM | POA: Diagnosis not present

## 2016-12-05 DIAGNOSIS — J3089 Other allergic rhinitis: Secondary | ICD-10-CM | POA: Diagnosis not present

## 2016-12-05 DIAGNOSIS — J301 Allergic rhinitis due to pollen: Secondary | ICD-10-CM | POA: Diagnosis not present

## 2016-12-13 DIAGNOSIS — D649 Anemia, unspecified: Secondary | ICD-10-CM | POA: Diagnosis not present

## 2016-12-13 DIAGNOSIS — E611 Iron deficiency: Secondary | ICD-10-CM | POA: Diagnosis not present

## 2016-12-13 DIAGNOSIS — D509 Iron deficiency anemia, unspecified: Secondary | ICD-10-CM | POA: Diagnosis not present

## 2016-12-13 DIAGNOSIS — D72819 Decreased white blood cell count, unspecified: Secondary | ICD-10-CM | POA: Diagnosis not present

## 2016-12-14 DIAGNOSIS — J3089 Other allergic rhinitis: Secondary | ICD-10-CM | POA: Diagnosis not present

## 2016-12-14 DIAGNOSIS — J301 Allergic rhinitis due to pollen: Secondary | ICD-10-CM | POA: Diagnosis not present

## 2016-12-26 DIAGNOSIS — J3089 Other allergic rhinitis: Secondary | ICD-10-CM | POA: Diagnosis not present

## 2016-12-26 DIAGNOSIS — J301 Allergic rhinitis due to pollen: Secondary | ICD-10-CM | POA: Diagnosis not present

## 2017-01-02 DIAGNOSIS — J3081 Allergic rhinitis due to animal (cat) (dog) hair and dander: Secondary | ICD-10-CM | POA: Diagnosis not present

## 2017-01-02 DIAGNOSIS — J3089 Other allergic rhinitis: Secondary | ICD-10-CM | POA: Diagnosis not present

## 2017-01-02 DIAGNOSIS — J301 Allergic rhinitis due to pollen: Secondary | ICD-10-CM | POA: Diagnosis not present

## 2017-01-09 DIAGNOSIS — J301 Allergic rhinitis due to pollen: Secondary | ICD-10-CM | POA: Diagnosis not present

## 2017-01-09 DIAGNOSIS — J3089 Other allergic rhinitis: Secondary | ICD-10-CM | POA: Diagnosis not present

## 2017-01-16 DIAGNOSIS — J301 Allergic rhinitis due to pollen: Secondary | ICD-10-CM | POA: Diagnosis not present

## 2017-01-16 DIAGNOSIS — J3089 Other allergic rhinitis: Secondary | ICD-10-CM | POA: Diagnosis not present

## 2017-01-23 DIAGNOSIS — H903 Sensorineural hearing loss, bilateral: Secondary | ICD-10-CM | POA: Diagnosis not present

## 2017-01-24 DIAGNOSIS — J301 Allergic rhinitis due to pollen: Secondary | ICD-10-CM | POA: Diagnosis not present

## 2017-01-24 DIAGNOSIS — J3081 Allergic rhinitis due to animal (cat) (dog) hair and dander: Secondary | ICD-10-CM | POA: Diagnosis not present

## 2017-01-24 DIAGNOSIS — J3089 Other allergic rhinitis: Secondary | ICD-10-CM | POA: Diagnosis not present

## 2017-01-25 DIAGNOSIS — S92422A Displaced fracture of distal phalanx of left great toe, initial encounter for closed fracture: Secondary | ICD-10-CM | POA: Diagnosis not present

## 2017-01-25 DIAGNOSIS — M205X2 Other deformities of toe(s) (acquired), left foot: Secondary | ICD-10-CM | POA: Diagnosis not present

## 2017-01-25 DIAGNOSIS — M79672 Pain in left foot: Secondary | ICD-10-CM | POA: Diagnosis not present

## 2017-01-26 ENCOUNTER — Ambulatory Visit: Payer: Medicare Other | Admitting: Podiatry

## 2017-02-06 DIAGNOSIS — M545 Low back pain: Secondary | ICD-10-CM | POA: Diagnosis not present

## 2017-02-07 DIAGNOSIS — J3089 Other allergic rhinitis: Secondary | ICD-10-CM | POA: Diagnosis not present

## 2017-02-07 DIAGNOSIS — J301 Allergic rhinitis due to pollen: Secondary | ICD-10-CM | POA: Diagnosis not present

## 2017-02-09 DIAGNOSIS — M5136 Other intervertebral disc degeneration, lumbar region: Secondary | ICD-10-CM | POA: Diagnosis not present

## 2017-02-09 DIAGNOSIS — M5126 Other intervertebral disc displacement, lumbar region: Secondary | ICD-10-CM | POA: Diagnosis not present

## 2017-02-09 DIAGNOSIS — M545 Low back pain: Secondary | ICD-10-CM | POA: Diagnosis not present

## 2017-02-13 DIAGNOSIS — J3089 Other allergic rhinitis: Secondary | ICD-10-CM | POA: Diagnosis not present

## 2017-02-13 DIAGNOSIS — J301 Allergic rhinitis due to pollen: Secondary | ICD-10-CM | POA: Diagnosis not present

## 2017-02-16 DIAGNOSIS — J301 Allergic rhinitis due to pollen: Secondary | ICD-10-CM | POA: Diagnosis not present

## 2017-02-16 DIAGNOSIS — J3089 Other allergic rhinitis: Secondary | ICD-10-CM | POA: Diagnosis not present

## 2017-02-17 DIAGNOSIS — R030 Elevated blood-pressure reading, without diagnosis of hypertension: Secondary | ICD-10-CM | POA: Diagnosis not present

## 2017-02-17 DIAGNOSIS — M545 Low back pain: Secondary | ICD-10-CM | POA: Diagnosis not present

## 2017-02-20 DIAGNOSIS — J3089 Other allergic rhinitis: Secondary | ICD-10-CM | POA: Diagnosis not present

## 2017-02-20 DIAGNOSIS — J301 Allergic rhinitis due to pollen: Secondary | ICD-10-CM | POA: Diagnosis not present

## 2017-02-27 DIAGNOSIS — J3081 Allergic rhinitis due to animal (cat) (dog) hair and dander: Secondary | ICD-10-CM | POA: Diagnosis not present

## 2017-02-27 DIAGNOSIS — J301 Allergic rhinitis due to pollen: Secondary | ICD-10-CM | POA: Diagnosis not present

## 2017-02-27 DIAGNOSIS — J3089 Other allergic rhinitis: Secondary | ICD-10-CM | POA: Diagnosis not present

## 2017-03-07 DIAGNOSIS — J3089 Other allergic rhinitis: Secondary | ICD-10-CM | POA: Diagnosis not present

## 2017-03-07 DIAGNOSIS — J301 Allergic rhinitis due to pollen: Secondary | ICD-10-CM | POA: Diagnosis not present

## 2017-03-09 DIAGNOSIS — J301 Allergic rhinitis due to pollen: Secondary | ICD-10-CM | POA: Diagnosis not present

## 2017-03-09 DIAGNOSIS — J3089 Other allergic rhinitis: Secondary | ICD-10-CM | POA: Diagnosis not present

## 2017-03-13 DIAGNOSIS — J301 Allergic rhinitis due to pollen: Secondary | ICD-10-CM | POA: Diagnosis not present

## 2017-03-13 DIAGNOSIS — J3089 Other allergic rhinitis: Secondary | ICD-10-CM | POA: Diagnosis not present

## 2017-03-15 DIAGNOSIS — J3089 Other allergic rhinitis: Secondary | ICD-10-CM | POA: Diagnosis not present

## 2017-03-15 DIAGNOSIS — J301 Allergic rhinitis due to pollen: Secondary | ICD-10-CM | POA: Diagnosis not present

## 2017-03-21 DIAGNOSIS — J301 Allergic rhinitis due to pollen: Secondary | ICD-10-CM | POA: Diagnosis not present

## 2017-03-21 DIAGNOSIS — J3089 Other allergic rhinitis: Secondary | ICD-10-CM | POA: Diagnosis not present

## 2017-03-27 DIAGNOSIS — J301 Allergic rhinitis due to pollen: Secondary | ICD-10-CM | POA: Diagnosis not present

## 2017-03-27 DIAGNOSIS — J3089 Other allergic rhinitis: Secondary | ICD-10-CM | POA: Diagnosis not present

## 2017-04-04 DIAGNOSIS — J3081 Allergic rhinitis due to animal (cat) (dog) hair and dander: Secondary | ICD-10-CM | POA: Diagnosis not present

## 2017-04-04 DIAGNOSIS — J3089 Other allergic rhinitis: Secondary | ICD-10-CM | POA: Diagnosis not present

## 2017-04-04 DIAGNOSIS — J301 Allergic rhinitis due to pollen: Secondary | ICD-10-CM | POA: Diagnosis not present

## 2017-04-10 DIAGNOSIS — J301 Allergic rhinitis due to pollen: Secondary | ICD-10-CM | POA: Diagnosis not present

## 2017-04-10 DIAGNOSIS — J3089 Other allergic rhinitis: Secondary | ICD-10-CM | POA: Diagnosis not present

## 2017-04-17 DIAGNOSIS — J3089 Other allergic rhinitis: Secondary | ICD-10-CM | POA: Diagnosis not present

## 2017-04-17 DIAGNOSIS — J301 Allergic rhinitis due to pollen: Secondary | ICD-10-CM | POA: Diagnosis not present

## 2017-04-17 DIAGNOSIS — J3081 Allergic rhinitis due to animal (cat) (dog) hair and dander: Secondary | ICD-10-CM | POA: Diagnosis not present

## 2017-04-24 DIAGNOSIS — J3089 Other allergic rhinitis: Secondary | ICD-10-CM | POA: Diagnosis not present

## 2017-04-24 DIAGNOSIS — J301 Allergic rhinitis due to pollen: Secondary | ICD-10-CM | POA: Diagnosis not present

## 2017-05-01 DIAGNOSIS — J301 Allergic rhinitis due to pollen: Secondary | ICD-10-CM | POA: Diagnosis not present

## 2017-05-01 DIAGNOSIS — J3089 Other allergic rhinitis: Secondary | ICD-10-CM | POA: Diagnosis not present

## 2017-05-08 DIAGNOSIS — H903 Sensorineural hearing loss, bilateral: Secondary | ICD-10-CM | POA: Diagnosis not present

## 2017-05-09 DIAGNOSIS — J301 Allergic rhinitis due to pollen: Secondary | ICD-10-CM | POA: Diagnosis not present

## 2017-05-09 DIAGNOSIS — J3089 Other allergic rhinitis: Secondary | ICD-10-CM | POA: Diagnosis not present

## 2017-05-09 DIAGNOSIS — J3081 Allergic rhinitis due to animal (cat) (dog) hair and dander: Secondary | ICD-10-CM | POA: Diagnosis not present

## 2017-05-15 DIAGNOSIS — J3089 Other allergic rhinitis: Secondary | ICD-10-CM | POA: Diagnosis not present

## 2017-05-15 DIAGNOSIS — J301 Allergic rhinitis due to pollen: Secondary | ICD-10-CM | POA: Diagnosis not present

## 2017-05-22 DIAGNOSIS — J301 Allergic rhinitis due to pollen: Secondary | ICD-10-CM | POA: Diagnosis not present

## 2017-05-22 DIAGNOSIS — J3089 Other allergic rhinitis: Secondary | ICD-10-CM | POA: Diagnosis not present

## 2017-05-23 ENCOUNTER — Ambulatory Visit: Payer: Medicare Other | Admitting: Podiatry

## 2017-05-30 DIAGNOSIS — J3089 Other allergic rhinitis: Secondary | ICD-10-CM | POA: Diagnosis not present

## 2017-05-30 DIAGNOSIS — J301 Allergic rhinitis due to pollen: Secondary | ICD-10-CM | POA: Diagnosis not present

## 2017-06-05 DIAGNOSIS — J301 Allergic rhinitis due to pollen: Secondary | ICD-10-CM | POA: Diagnosis not present

## 2017-06-05 DIAGNOSIS — J3089 Other allergic rhinitis: Secondary | ICD-10-CM | POA: Diagnosis not present

## 2017-06-12 DIAGNOSIS — J301 Allergic rhinitis due to pollen: Secondary | ICD-10-CM | POA: Diagnosis not present

## 2017-06-12 DIAGNOSIS — J3089 Other allergic rhinitis: Secondary | ICD-10-CM | POA: Diagnosis not present

## 2017-06-14 DIAGNOSIS — J301 Allergic rhinitis due to pollen: Secondary | ICD-10-CM | POA: Diagnosis not present

## 2017-06-14 DIAGNOSIS — J3081 Allergic rhinitis due to animal (cat) (dog) hair and dander: Secondary | ICD-10-CM | POA: Diagnosis not present

## 2017-06-14 DIAGNOSIS — J3089 Other allergic rhinitis: Secondary | ICD-10-CM | POA: Diagnosis not present

## 2017-06-15 ENCOUNTER — Ambulatory Visit (INDEPENDENT_AMBULATORY_CARE_PROVIDER_SITE_OTHER): Payer: Medicare Other | Admitting: Podiatry

## 2017-06-15 DIAGNOSIS — M7750 Other enthesopathy of unspecified foot: Secondary | ICD-10-CM

## 2017-06-15 DIAGNOSIS — M778 Other enthesopathies, not elsewhere classified: Secondary | ICD-10-CM

## 2017-06-15 DIAGNOSIS — M779 Enthesopathy, unspecified: Principal | ICD-10-CM

## 2017-06-17 NOTE — Progress Notes (Signed)
He was seen by Raiford Nobleick today to have a new set of orthotics made.

## 2017-06-19 DIAGNOSIS — J301 Allergic rhinitis due to pollen: Secondary | ICD-10-CM | POA: Diagnosis not present

## 2017-06-19 DIAGNOSIS — J3089 Other allergic rhinitis: Secondary | ICD-10-CM | POA: Diagnosis not present

## 2017-06-27 DIAGNOSIS — J342 Deviated nasal septum: Secondary | ICD-10-CM | POA: Diagnosis not present

## 2017-06-27 DIAGNOSIS — R51 Headache: Secondary | ICD-10-CM | POA: Diagnosis not present

## 2017-06-27 DIAGNOSIS — J310 Chronic rhinitis: Secondary | ICD-10-CM | POA: Diagnosis not present

## 2017-06-28 DIAGNOSIS — J301 Allergic rhinitis due to pollen: Secondary | ICD-10-CM | POA: Diagnosis not present

## 2017-06-28 DIAGNOSIS — J3089 Other allergic rhinitis: Secondary | ICD-10-CM | POA: Diagnosis not present

## 2017-07-03 DIAGNOSIS — J301 Allergic rhinitis due to pollen: Secondary | ICD-10-CM | POA: Diagnosis not present

## 2017-07-03 DIAGNOSIS — J3089 Other allergic rhinitis: Secondary | ICD-10-CM | POA: Diagnosis not present

## 2017-07-06 ENCOUNTER — Encounter: Payer: Medicare Other | Admitting: Orthotics

## 2017-07-12 DIAGNOSIS — J301 Allergic rhinitis due to pollen: Secondary | ICD-10-CM | POA: Diagnosis not present

## 2017-07-12 DIAGNOSIS — J3089 Other allergic rhinitis: Secondary | ICD-10-CM | POA: Diagnosis not present

## 2017-07-12 DIAGNOSIS — J3081 Allergic rhinitis due to animal (cat) (dog) hair and dander: Secondary | ICD-10-CM | POA: Diagnosis not present

## 2017-07-17 DIAGNOSIS — J301 Allergic rhinitis due to pollen: Secondary | ICD-10-CM | POA: Diagnosis not present

## 2017-07-17 DIAGNOSIS — J3089 Other allergic rhinitis: Secondary | ICD-10-CM | POA: Diagnosis not present

## 2017-07-19 DIAGNOSIS — M5416 Radiculopathy, lumbar region: Secondary | ICD-10-CM | POA: Diagnosis not present

## 2017-07-19 DIAGNOSIS — R030 Elevated blood-pressure reading, without diagnosis of hypertension: Secondary | ICD-10-CM | POA: Diagnosis not present

## 2017-07-20 ENCOUNTER — Ambulatory Visit: Payer: Medicare Other | Admitting: Orthotics

## 2017-07-20 DIAGNOSIS — M779 Enthesopathy, unspecified: Secondary | ICD-10-CM

## 2017-07-20 DIAGNOSIS — M778 Other enthesopathies, not elsewhere classified: Secondary | ICD-10-CM

## 2017-07-20 NOTE — Progress Notes (Signed)
Patient came in today to pick up custom made foot orthotics.  The goals were accomplished and the patient reported no dissatisfaction with said orthotics.  Patient was advised of breakin period and how to report any issues. 

## 2017-07-24 DIAGNOSIS — J301 Allergic rhinitis due to pollen: Secondary | ICD-10-CM | POA: Diagnosis not present

## 2017-07-24 DIAGNOSIS — J3089 Other allergic rhinitis: Secondary | ICD-10-CM | POA: Diagnosis not present

## 2017-07-26 DIAGNOSIS — H903 Sensorineural hearing loss, bilateral: Secondary | ICD-10-CM | POA: Diagnosis not present

## 2017-07-27 DIAGNOSIS — J301 Allergic rhinitis due to pollen: Secondary | ICD-10-CM | POA: Diagnosis not present

## 2017-07-27 DIAGNOSIS — J3089 Other allergic rhinitis: Secondary | ICD-10-CM | POA: Diagnosis not present

## 2017-07-28 DIAGNOSIS — J342 Deviated nasal septum: Secondary | ICD-10-CM | POA: Diagnosis not present

## 2017-07-31 DIAGNOSIS — J301 Allergic rhinitis due to pollen: Secondary | ICD-10-CM | POA: Diagnosis not present

## 2017-07-31 DIAGNOSIS — J3089 Other allergic rhinitis: Secondary | ICD-10-CM | POA: Diagnosis not present

## 2017-08-04 DIAGNOSIS — J3089 Other allergic rhinitis: Secondary | ICD-10-CM | POA: Diagnosis not present

## 2017-08-04 DIAGNOSIS — J301 Allergic rhinitis due to pollen: Secondary | ICD-10-CM | POA: Diagnosis not present

## 2017-08-07 DIAGNOSIS — I10 Essential (primary) hypertension: Secondary | ICD-10-CM | POA: Diagnosis not present

## 2017-08-07 DIAGNOSIS — Z125 Encounter for screening for malignant neoplasm of prostate: Secondary | ICD-10-CM | POA: Diagnosis not present

## 2017-08-08 DIAGNOSIS — J3089 Other allergic rhinitis: Secondary | ICD-10-CM | POA: Diagnosis not present

## 2017-08-08 DIAGNOSIS — J301 Allergic rhinitis due to pollen: Secondary | ICD-10-CM | POA: Diagnosis not present

## 2017-08-14 DIAGNOSIS — G4700 Insomnia, unspecified: Secondary | ICD-10-CM | POA: Diagnosis not present

## 2017-08-14 DIAGNOSIS — M7062 Trochanteric bursitis, left hip: Secondary | ICD-10-CM | POA: Diagnosis not present

## 2017-08-14 DIAGNOSIS — Z0000 Encounter for general adult medical examination without abnormal findings: Secondary | ICD-10-CM | POA: Diagnosis not present

## 2017-08-14 DIAGNOSIS — R030 Elevated blood-pressure reading, without diagnosis of hypertension: Secondary | ICD-10-CM | POA: Diagnosis not present

## 2017-08-21 DIAGNOSIS — J301 Allergic rhinitis due to pollen: Secondary | ICD-10-CM | POA: Diagnosis not present

## 2017-08-21 DIAGNOSIS — J3089 Other allergic rhinitis: Secondary | ICD-10-CM | POA: Diagnosis not present

## 2017-08-22 DIAGNOSIS — H903 Sensorineural hearing loss, bilateral: Secondary | ICD-10-CM | POA: Diagnosis not present

## 2017-08-22 DIAGNOSIS — H6123 Impacted cerumen, bilateral: Secondary | ICD-10-CM | POA: Diagnosis not present

## 2017-08-28 DIAGNOSIS — J301 Allergic rhinitis due to pollen: Secondary | ICD-10-CM | POA: Diagnosis not present

## 2017-08-28 DIAGNOSIS — J3089 Other allergic rhinitis: Secondary | ICD-10-CM | POA: Diagnosis not present

## 2017-09-04 DIAGNOSIS — J3089 Other allergic rhinitis: Secondary | ICD-10-CM | POA: Diagnosis not present

## 2017-09-04 DIAGNOSIS — J301 Allergic rhinitis due to pollen: Secondary | ICD-10-CM | POA: Diagnosis not present

## 2017-09-11 DIAGNOSIS — J301 Allergic rhinitis due to pollen: Secondary | ICD-10-CM | POA: Diagnosis not present

## 2017-09-18 DIAGNOSIS — J301 Allergic rhinitis due to pollen: Secondary | ICD-10-CM | POA: Diagnosis not present

## 2017-09-18 DIAGNOSIS — J3089 Other allergic rhinitis: Secondary | ICD-10-CM | POA: Diagnosis not present

## 2017-09-25 DIAGNOSIS — J301 Allergic rhinitis due to pollen: Secondary | ICD-10-CM | POA: Diagnosis not present

## 2017-09-25 DIAGNOSIS — J3089 Other allergic rhinitis: Secondary | ICD-10-CM | POA: Diagnosis not present

## 2017-10-02 DIAGNOSIS — J3089 Other allergic rhinitis: Secondary | ICD-10-CM | POA: Diagnosis not present

## 2017-10-02 DIAGNOSIS — J301 Allergic rhinitis due to pollen: Secondary | ICD-10-CM | POA: Diagnosis not present

## 2017-10-09 DIAGNOSIS — J3089 Other allergic rhinitis: Secondary | ICD-10-CM | POA: Diagnosis not present

## 2017-10-09 DIAGNOSIS — J301 Allergic rhinitis due to pollen: Secondary | ICD-10-CM | POA: Diagnosis not present

## 2017-10-16 DIAGNOSIS — J301 Allergic rhinitis due to pollen: Secondary | ICD-10-CM | POA: Diagnosis not present

## 2017-10-16 DIAGNOSIS — J3089 Other allergic rhinitis: Secondary | ICD-10-CM | POA: Diagnosis not present

## 2017-10-23 DIAGNOSIS — J3089 Other allergic rhinitis: Secondary | ICD-10-CM | POA: Diagnosis not present

## 2017-10-23 DIAGNOSIS — J301 Allergic rhinitis due to pollen: Secondary | ICD-10-CM | POA: Diagnosis not present

## 2017-10-30 DIAGNOSIS — J3089 Other allergic rhinitis: Secondary | ICD-10-CM | POA: Diagnosis not present

## 2017-10-30 DIAGNOSIS — J301 Allergic rhinitis due to pollen: Secondary | ICD-10-CM | POA: Diagnosis not present

## 2017-11-03 DIAGNOSIS — H6123 Impacted cerumen, bilateral: Secondary | ICD-10-CM | POA: Diagnosis not present

## 2017-11-06 DIAGNOSIS — J301 Allergic rhinitis due to pollen: Secondary | ICD-10-CM | POA: Diagnosis not present

## 2017-11-06 DIAGNOSIS — J3089 Other allergic rhinitis: Secondary | ICD-10-CM | POA: Diagnosis not present

## 2017-11-13 DIAGNOSIS — J301 Allergic rhinitis due to pollen: Secondary | ICD-10-CM | POA: Diagnosis not present

## 2017-11-13 DIAGNOSIS — J3089 Other allergic rhinitis: Secondary | ICD-10-CM | POA: Diagnosis not present

## 2017-11-21 DIAGNOSIS — J301 Allergic rhinitis due to pollen: Secondary | ICD-10-CM | POA: Diagnosis not present

## 2017-11-21 DIAGNOSIS — J3089 Other allergic rhinitis: Secondary | ICD-10-CM | POA: Diagnosis not present

## 2017-11-27 DIAGNOSIS — J301 Allergic rhinitis due to pollen: Secondary | ICD-10-CM | POA: Diagnosis not present

## 2017-11-27 DIAGNOSIS — J3089 Other allergic rhinitis: Secondary | ICD-10-CM | POA: Diagnosis not present

## 2017-11-29 DIAGNOSIS — L853 Xerosis cutis: Secondary | ICD-10-CM | POA: Diagnosis not present

## 2017-11-29 DIAGNOSIS — K130 Diseases of lips: Secondary | ICD-10-CM | POA: Diagnosis not present

## 2017-12-01 DIAGNOSIS — H6123 Impacted cerumen, bilateral: Secondary | ICD-10-CM | POA: Diagnosis not present

## 2017-12-04 DIAGNOSIS — J3089 Other allergic rhinitis: Secondary | ICD-10-CM | POA: Diagnosis not present

## 2017-12-04 DIAGNOSIS — J301 Allergic rhinitis due to pollen: Secondary | ICD-10-CM | POA: Diagnosis not present

## 2017-12-13 DIAGNOSIS — J301 Allergic rhinitis due to pollen: Secondary | ICD-10-CM | POA: Diagnosis not present

## 2017-12-13 DIAGNOSIS — J3089 Other allergic rhinitis: Secondary | ICD-10-CM | POA: Diagnosis not present

## 2017-12-18 DIAGNOSIS — J3089 Other allergic rhinitis: Secondary | ICD-10-CM | POA: Diagnosis not present

## 2017-12-18 DIAGNOSIS — J301 Allergic rhinitis due to pollen: Secondary | ICD-10-CM | POA: Diagnosis not present

## 2017-12-25 DIAGNOSIS — J301 Allergic rhinitis due to pollen: Secondary | ICD-10-CM | POA: Diagnosis not present

## 2017-12-25 DIAGNOSIS — J3089 Other allergic rhinitis: Secondary | ICD-10-CM | POA: Diagnosis not present

## 2017-12-27 DIAGNOSIS — J3089 Other allergic rhinitis: Secondary | ICD-10-CM | POA: Diagnosis not present

## 2017-12-27 DIAGNOSIS — J301 Allergic rhinitis due to pollen: Secondary | ICD-10-CM | POA: Diagnosis not present

## 2018-01-01 DIAGNOSIS — J301 Allergic rhinitis due to pollen: Secondary | ICD-10-CM | POA: Diagnosis not present

## 2018-01-01 DIAGNOSIS — J3089 Other allergic rhinitis: Secondary | ICD-10-CM | POA: Diagnosis not present

## 2018-01-04 DIAGNOSIS — J301 Allergic rhinitis due to pollen: Secondary | ICD-10-CM | POA: Diagnosis not present

## 2018-01-04 DIAGNOSIS — J3089 Other allergic rhinitis: Secondary | ICD-10-CM | POA: Diagnosis not present

## 2018-01-08 DIAGNOSIS — J3089 Other allergic rhinitis: Secondary | ICD-10-CM | POA: Diagnosis not present

## 2018-01-08 DIAGNOSIS — J301 Allergic rhinitis due to pollen: Secondary | ICD-10-CM | POA: Diagnosis not present

## 2018-01-15 DIAGNOSIS — J3089 Other allergic rhinitis: Secondary | ICD-10-CM | POA: Diagnosis not present

## 2018-01-15 DIAGNOSIS — J301 Allergic rhinitis due to pollen: Secondary | ICD-10-CM | POA: Diagnosis not present

## 2018-01-22 DIAGNOSIS — J301 Allergic rhinitis due to pollen: Secondary | ICD-10-CM | POA: Diagnosis not present

## 2018-01-22 DIAGNOSIS — J3089 Other allergic rhinitis: Secondary | ICD-10-CM | POA: Diagnosis not present

## 2018-01-29 DIAGNOSIS — J301 Allergic rhinitis due to pollen: Secondary | ICD-10-CM | POA: Diagnosis not present

## 2018-01-29 DIAGNOSIS — J3089 Other allergic rhinitis: Secondary | ICD-10-CM | POA: Diagnosis not present

## 2018-02-02 DIAGNOSIS — H6123 Impacted cerumen, bilateral: Secondary | ICD-10-CM | POA: Diagnosis not present

## 2018-02-08 DIAGNOSIS — J3089 Other allergic rhinitis: Secondary | ICD-10-CM | POA: Diagnosis not present

## 2018-02-08 DIAGNOSIS — J301 Allergic rhinitis due to pollen: Secondary | ICD-10-CM | POA: Diagnosis not present

## 2018-02-19 DIAGNOSIS — J301 Allergic rhinitis due to pollen: Secondary | ICD-10-CM | POA: Diagnosis not present

## 2018-02-19 DIAGNOSIS — J3089 Other allergic rhinitis: Secondary | ICD-10-CM | POA: Diagnosis not present

## 2018-02-21 DIAGNOSIS — J321 Chronic frontal sinusitis: Secondary | ICD-10-CM | POA: Diagnosis not present

## 2018-02-21 DIAGNOSIS — J310 Chronic rhinitis: Secondary | ICD-10-CM | POA: Diagnosis not present

## 2018-02-23 ENCOUNTER — Other Ambulatory Visit (INDEPENDENT_AMBULATORY_CARE_PROVIDER_SITE_OTHER): Payer: Self-pay | Admitting: Otolaryngology

## 2018-02-23 DIAGNOSIS — M25552 Pain in left hip: Secondary | ICD-10-CM | POA: Diagnosis not present

## 2018-02-23 DIAGNOSIS — M25551 Pain in right hip: Secondary | ICD-10-CM | POA: Diagnosis not present

## 2018-02-23 DIAGNOSIS — J329 Chronic sinusitis, unspecified: Secondary | ICD-10-CM

## 2018-02-26 DIAGNOSIS — H903 Sensorineural hearing loss, bilateral: Secondary | ICD-10-CM | POA: Diagnosis not present

## 2018-03-01 ENCOUNTER — Ambulatory Visit
Admission: RE | Admit: 2018-03-01 | Discharge: 2018-03-01 | Disposition: A | Discharge status: Home / Self Care | Payer: Medicare Other | Source: Ambulatory Visit | Attending: Otolaryngology | Admitting: Otolaryngology

## 2018-03-01 DIAGNOSIS — J329 Chronic sinusitis, unspecified: Secondary | ICD-10-CM | POA: Diagnosis not present

## 2018-03-05 DIAGNOSIS — J301 Allergic rhinitis due to pollen: Secondary | ICD-10-CM | POA: Diagnosis not present

## 2018-03-05 DIAGNOSIS — J3089 Other allergic rhinitis: Secondary | ICD-10-CM | POA: Diagnosis not present

## 2018-03-16 DIAGNOSIS — Z23 Encounter for immunization: Secondary | ICD-10-CM | POA: Diagnosis not present

## 2018-03-20 DIAGNOSIS — J301 Allergic rhinitis due to pollen: Secondary | ICD-10-CM | POA: Diagnosis not present

## 2018-03-20 DIAGNOSIS — J3089 Other allergic rhinitis: Secondary | ICD-10-CM | POA: Diagnosis not present

## 2018-04-03 DIAGNOSIS — J301 Allergic rhinitis due to pollen: Secondary | ICD-10-CM | POA: Diagnosis not present

## 2018-04-03 DIAGNOSIS — J3089 Other allergic rhinitis: Secondary | ICD-10-CM | POA: Diagnosis not present

## 2018-04-06 DIAGNOSIS — H903 Sensorineural hearing loss, bilateral: Secondary | ICD-10-CM | POA: Diagnosis not present

## 2018-04-16 DIAGNOSIS — J301 Allergic rhinitis due to pollen: Secondary | ICD-10-CM | POA: Diagnosis not present

## 2018-04-16 DIAGNOSIS — J3089 Other allergic rhinitis: Secondary | ICD-10-CM | POA: Diagnosis not present

## 2018-04-30 DIAGNOSIS — J301 Allergic rhinitis due to pollen: Secondary | ICD-10-CM | POA: Diagnosis not present

## 2018-04-30 DIAGNOSIS — J3089 Other allergic rhinitis: Secondary | ICD-10-CM | POA: Diagnosis not present

## 2018-05-14 DIAGNOSIS — J301 Allergic rhinitis due to pollen: Secondary | ICD-10-CM | POA: Diagnosis not present

## 2018-05-14 DIAGNOSIS — J3089 Other allergic rhinitis: Secondary | ICD-10-CM | POA: Diagnosis not present

## 2018-05-30 DIAGNOSIS — J3089 Other allergic rhinitis: Secondary | ICD-10-CM | POA: Diagnosis not present

## 2018-05-30 DIAGNOSIS — J301 Allergic rhinitis due to pollen: Secondary | ICD-10-CM | POA: Diagnosis not present

## 2018-06-11 DIAGNOSIS — J3089 Other allergic rhinitis: Secondary | ICD-10-CM | POA: Diagnosis not present

## 2018-06-11 DIAGNOSIS — J3081 Allergic rhinitis due to animal (cat) (dog) hair and dander: Secondary | ICD-10-CM | POA: Diagnosis not present

## 2018-06-11 DIAGNOSIS — J300 Vasomotor rhinitis: Secondary | ICD-10-CM | POA: Diagnosis not present

## 2018-06-11 DIAGNOSIS — J301 Allergic rhinitis due to pollen: Secondary | ICD-10-CM | POA: Diagnosis not present

## 2018-06-18 DIAGNOSIS — J3081 Allergic rhinitis due to animal (cat) (dog) hair and dander: Secondary | ICD-10-CM | POA: Diagnosis not present

## 2018-06-18 DIAGNOSIS — J300 Vasomotor rhinitis: Secondary | ICD-10-CM | POA: Diagnosis not present

## 2018-06-18 DIAGNOSIS — J3089 Other allergic rhinitis: Secondary | ICD-10-CM | POA: Diagnosis not present

## 2018-06-18 DIAGNOSIS — J301 Allergic rhinitis due to pollen: Secondary | ICD-10-CM | POA: Diagnosis not present

## 2018-06-22 DIAGNOSIS — J301 Allergic rhinitis due to pollen: Secondary | ICD-10-CM | POA: Diagnosis not present

## 2018-06-22 DIAGNOSIS — J3089 Other allergic rhinitis: Secondary | ICD-10-CM | POA: Diagnosis not present

## 2018-06-25 DIAGNOSIS — J3081 Allergic rhinitis due to animal (cat) (dog) hair and dander: Secondary | ICD-10-CM | POA: Diagnosis not present

## 2018-06-25 DIAGNOSIS — J301 Allergic rhinitis due to pollen: Secondary | ICD-10-CM | POA: Diagnosis not present

## 2018-06-25 DIAGNOSIS — J3089 Other allergic rhinitis: Secondary | ICD-10-CM | POA: Diagnosis not present

## 2018-07-02 DIAGNOSIS — J3089 Other allergic rhinitis: Secondary | ICD-10-CM | POA: Diagnosis not present

## 2018-07-02 DIAGNOSIS — J301 Allergic rhinitis due to pollen: Secondary | ICD-10-CM | POA: Diagnosis not present

## 2018-07-02 DIAGNOSIS — J3081 Allergic rhinitis due to animal (cat) (dog) hair and dander: Secondary | ICD-10-CM | POA: Diagnosis not present

## 2018-07-04 DIAGNOSIS — J301 Allergic rhinitis due to pollen: Secondary | ICD-10-CM | POA: Diagnosis not present

## 2018-07-04 DIAGNOSIS — J3089 Other allergic rhinitis: Secondary | ICD-10-CM | POA: Diagnosis not present

## 2018-07-04 DIAGNOSIS — J3081 Allergic rhinitis due to animal (cat) (dog) hair and dander: Secondary | ICD-10-CM | POA: Diagnosis not present

## 2018-07-06 DIAGNOSIS — J3081 Allergic rhinitis due to animal (cat) (dog) hair and dander: Secondary | ICD-10-CM | POA: Diagnosis not present

## 2018-07-06 DIAGNOSIS — J3089 Other allergic rhinitis: Secondary | ICD-10-CM | POA: Diagnosis not present

## 2018-07-06 DIAGNOSIS — J301 Allergic rhinitis due to pollen: Secondary | ICD-10-CM | POA: Diagnosis not present

## 2018-07-09 DIAGNOSIS — J3081 Allergic rhinitis due to animal (cat) (dog) hair and dander: Secondary | ICD-10-CM | POA: Diagnosis not present

## 2018-07-09 DIAGNOSIS — J3089 Other allergic rhinitis: Secondary | ICD-10-CM | POA: Diagnosis not present

## 2018-07-09 DIAGNOSIS — J301 Allergic rhinitis due to pollen: Secondary | ICD-10-CM | POA: Diagnosis not present

## 2018-07-13 DIAGNOSIS — J3089 Other allergic rhinitis: Secondary | ICD-10-CM | POA: Diagnosis not present

## 2018-07-13 DIAGNOSIS — J3081 Allergic rhinitis due to animal (cat) (dog) hair and dander: Secondary | ICD-10-CM | POA: Diagnosis not present

## 2018-07-13 DIAGNOSIS — J301 Allergic rhinitis due to pollen: Secondary | ICD-10-CM | POA: Diagnosis not present

## 2018-07-16 DIAGNOSIS — J3081 Allergic rhinitis due to animal (cat) (dog) hair and dander: Secondary | ICD-10-CM | POA: Diagnosis not present

## 2018-07-16 DIAGNOSIS — J3089 Other allergic rhinitis: Secondary | ICD-10-CM | POA: Diagnosis not present

## 2018-07-16 DIAGNOSIS — J301 Allergic rhinitis due to pollen: Secondary | ICD-10-CM | POA: Diagnosis not present

## 2018-07-20 DIAGNOSIS — J3089 Other allergic rhinitis: Secondary | ICD-10-CM | POA: Diagnosis not present

## 2018-07-20 DIAGNOSIS — J301 Allergic rhinitis due to pollen: Secondary | ICD-10-CM | POA: Diagnosis not present

## 2018-07-20 DIAGNOSIS — J3081 Allergic rhinitis due to animal (cat) (dog) hair and dander: Secondary | ICD-10-CM | POA: Diagnosis not present

## 2018-07-23 DIAGNOSIS — J3081 Allergic rhinitis due to animal (cat) (dog) hair and dander: Secondary | ICD-10-CM | POA: Diagnosis not present

## 2018-07-23 DIAGNOSIS — J301 Allergic rhinitis due to pollen: Secondary | ICD-10-CM | POA: Diagnosis not present

## 2018-07-23 DIAGNOSIS — J3089 Other allergic rhinitis: Secondary | ICD-10-CM | POA: Diagnosis not present

## 2018-07-26 DIAGNOSIS — J3081 Allergic rhinitis due to animal (cat) (dog) hair and dander: Secondary | ICD-10-CM | POA: Diagnosis not present

## 2018-07-26 DIAGNOSIS — J3089 Other allergic rhinitis: Secondary | ICD-10-CM | POA: Diagnosis not present

## 2018-07-26 DIAGNOSIS — J301 Allergic rhinitis due to pollen: Secondary | ICD-10-CM | POA: Diagnosis not present

## 2018-07-30 DIAGNOSIS — J3081 Allergic rhinitis due to animal (cat) (dog) hair and dander: Secondary | ICD-10-CM | POA: Diagnosis not present

## 2018-07-30 DIAGNOSIS — J3089 Other allergic rhinitis: Secondary | ICD-10-CM | POA: Diagnosis not present

## 2018-07-30 DIAGNOSIS — J301 Allergic rhinitis due to pollen: Secondary | ICD-10-CM | POA: Diagnosis not present

## 2018-07-31 DIAGNOSIS — H2513 Age-related nuclear cataract, bilateral: Secondary | ICD-10-CM | POA: Diagnosis not present

## 2018-08-03 DIAGNOSIS — J3081 Allergic rhinitis due to animal (cat) (dog) hair and dander: Secondary | ICD-10-CM | POA: Diagnosis not present

## 2018-08-03 DIAGNOSIS — J3089 Other allergic rhinitis: Secondary | ICD-10-CM | POA: Diagnosis not present

## 2018-08-03 DIAGNOSIS — J301 Allergic rhinitis due to pollen: Secondary | ICD-10-CM | POA: Diagnosis not present

## 2018-08-06 DIAGNOSIS — J3089 Other allergic rhinitis: Secondary | ICD-10-CM | POA: Diagnosis not present

## 2018-08-06 DIAGNOSIS — J301 Allergic rhinitis due to pollen: Secondary | ICD-10-CM | POA: Diagnosis not present

## 2018-08-06 DIAGNOSIS — J3081 Allergic rhinitis due to animal (cat) (dog) hair and dander: Secondary | ICD-10-CM | POA: Diagnosis not present

## 2018-08-10 DIAGNOSIS — I10 Essential (primary) hypertension: Secondary | ICD-10-CM | POA: Diagnosis not present

## 2018-08-10 DIAGNOSIS — J301 Allergic rhinitis due to pollen: Secondary | ICD-10-CM | POA: Diagnosis not present

## 2018-08-10 DIAGNOSIS — J3081 Allergic rhinitis due to animal (cat) (dog) hair and dander: Secondary | ICD-10-CM | POA: Diagnosis not present

## 2018-08-10 DIAGNOSIS — Z125 Encounter for screening for malignant neoplasm of prostate: Secondary | ICD-10-CM | POA: Diagnosis not present

## 2018-08-10 DIAGNOSIS — J3089 Other allergic rhinitis: Secondary | ICD-10-CM | POA: Diagnosis not present

## 2018-08-13 DIAGNOSIS — J3081 Allergic rhinitis due to animal (cat) (dog) hair and dander: Secondary | ICD-10-CM | POA: Diagnosis not present

## 2018-08-13 DIAGNOSIS — J301 Allergic rhinitis due to pollen: Secondary | ICD-10-CM | POA: Diagnosis not present

## 2018-08-13 DIAGNOSIS — J3089 Other allergic rhinitis: Secondary | ICD-10-CM | POA: Diagnosis not present

## 2018-08-17 DIAGNOSIS — J3081 Allergic rhinitis due to animal (cat) (dog) hair and dander: Secondary | ICD-10-CM | POA: Diagnosis not present

## 2018-08-17 DIAGNOSIS — J3089 Other allergic rhinitis: Secondary | ICD-10-CM | POA: Diagnosis not present

## 2018-08-17 DIAGNOSIS — J301 Allergic rhinitis due to pollen: Secondary | ICD-10-CM | POA: Diagnosis not present

## 2018-08-20 DIAGNOSIS — J3081 Allergic rhinitis due to animal (cat) (dog) hair and dander: Secondary | ICD-10-CM | POA: Diagnosis not present

## 2018-08-20 DIAGNOSIS — J301 Allergic rhinitis due to pollen: Secondary | ICD-10-CM | POA: Diagnosis not present

## 2018-08-20 DIAGNOSIS — J3089 Other allergic rhinitis: Secondary | ICD-10-CM | POA: Diagnosis not present

## 2018-08-22 DIAGNOSIS — J069 Acute upper respiratory infection, unspecified: Secondary | ICD-10-CM | POA: Diagnosis not present

## 2018-08-24 DIAGNOSIS — J3081 Allergic rhinitis due to animal (cat) (dog) hair and dander: Secondary | ICD-10-CM | POA: Diagnosis not present

## 2018-08-24 DIAGNOSIS — J3089 Other allergic rhinitis: Secondary | ICD-10-CM | POA: Diagnosis not present

## 2018-08-24 DIAGNOSIS — J301 Allergic rhinitis due to pollen: Secondary | ICD-10-CM | POA: Diagnosis not present

## 2018-08-27 DIAGNOSIS — J301 Allergic rhinitis due to pollen: Secondary | ICD-10-CM | POA: Diagnosis not present

## 2018-08-27 DIAGNOSIS — J3081 Allergic rhinitis due to animal (cat) (dog) hair and dander: Secondary | ICD-10-CM | POA: Diagnosis not present

## 2018-08-27 DIAGNOSIS — J3089 Other allergic rhinitis: Secondary | ICD-10-CM | POA: Diagnosis not present

## 2018-08-29 DIAGNOSIS — J301 Allergic rhinitis due to pollen: Secondary | ICD-10-CM | POA: Diagnosis not present

## 2018-08-29 DIAGNOSIS — J3089 Other allergic rhinitis: Secondary | ICD-10-CM | POA: Diagnosis not present

## 2018-08-29 DIAGNOSIS — J3081 Allergic rhinitis due to animal (cat) (dog) hair and dander: Secondary | ICD-10-CM | POA: Diagnosis not present

## 2018-09-03 DIAGNOSIS — J3089 Other allergic rhinitis: Secondary | ICD-10-CM | POA: Diagnosis not present

## 2018-09-03 DIAGNOSIS — J3081 Allergic rhinitis due to animal (cat) (dog) hair and dander: Secondary | ICD-10-CM | POA: Diagnosis not present

## 2018-09-03 DIAGNOSIS — J301 Allergic rhinitis due to pollen: Secondary | ICD-10-CM | POA: Diagnosis not present

## 2018-09-06 DIAGNOSIS — J301 Allergic rhinitis due to pollen: Secondary | ICD-10-CM | POA: Diagnosis not present

## 2018-09-06 DIAGNOSIS — J3089 Other allergic rhinitis: Secondary | ICD-10-CM | POA: Diagnosis not present

## 2018-09-06 DIAGNOSIS — J3081 Allergic rhinitis due to animal (cat) (dog) hair and dander: Secondary | ICD-10-CM | POA: Diagnosis not present

## 2018-09-10 DIAGNOSIS — J301 Allergic rhinitis due to pollen: Secondary | ICD-10-CM | POA: Diagnosis not present

## 2018-09-10 DIAGNOSIS — J3081 Allergic rhinitis due to animal (cat) (dog) hair and dander: Secondary | ICD-10-CM | POA: Diagnosis not present

## 2018-09-10 DIAGNOSIS — J3089 Other allergic rhinitis: Secondary | ICD-10-CM | POA: Diagnosis not present

## 2018-09-17 DIAGNOSIS — J3081 Allergic rhinitis due to animal (cat) (dog) hair and dander: Secondary | ICD-10-CM | POA: Diagnosis not present

## 2018-09-17 DIAGNOSIS — J301 Allergic rhinitis due to pollen: Secondary | ICD-10-CM | POA: Diagnosis not present

## 2018-09-17 DIAGNOSIS — J3089 Other allergic rhinitis: Secondary | ICD-10-CM | POA: Diagnosis not present

## 2018-09-24 DIAGNOSIS — J3089 Other allergic rhinitis: Secondary | ICD-10-CM | POA: Diagnosis not present

## 2018-09-24 DIAGNOSIS — J301 Allergic rhinitis due to pollen: Secondary | ICD-10-CM | POA: Diagnosis not present

## 2018-09-24 DIAGNOSIS — J3081 Allergic rhinitis due to animal (cat) (dog) hair and dander: Secondary | ICD-10-CM | POA: Diagnosis not present

## 2018-09-28 DIAGNOSIS — J3081 Allergic rhinitis due to animal (cat) (dog) hair and dander: Secondary | ICD-10-CM | POA: Diagnosis not present

## 2018-09-28 DIAGNOSIS — J301 Allergic rhinitis due to pollen: Secondary | ICD-10-CM | POA: Diagnosis not present

## 2018-09-28 DIAGNOSIS — J3089 Other allergic rhinitis: Secondary | ICD-10-CM | POA: Diagnosis not present

## 2018-10-02 DIAGNOSIS — J301 Allergic rhinitis due to pollen: Secondary | ICD-10-CM | POA: Diagnosis not present

## 2018-10-02 DIAGNOSIS — J3081 Allergic rhinitis due to animal (cat) (dog) hair and dander: Secondary | ICD-10-CM | POA: Diagnosis not present

## 2018-10-02 DIAGNOSIS — J3089 Other allergic rhinitis: Secondary | ICD-10-CM | POA: Diagnosis not present

## 2018-10-08 DIAGNOSIS — J3089 Other allergic rhinitis: Secondary | ICD-10-CM | POA: Diagnosis not present

## 2018-10-08 DIAGNOSIS — J3081 Allergic rhinitis due to animal (cat) (dog) hair and dander: Secondary | ICD-10-CM | POA: Diagnosis not present

## 2018-10-08 DIAGNOSIS — J301 Allergic rhinitis due to pollen: Secondary | ICD-10-CM | POA: Diagnosis not present

## 2018-10-15 DIAGNOSIS — J3081 Allergic rhinitis due to animal (cat) (dog) hair and dander: Secondary | ICD-10-CM | POA: Diagnosis not present

## 2018-10-15 DIAGNOSIS — J3089 Other allergic rhinitis: Secondary | ICD-10-CM | POA: Diagnosis not present

## 2018-10-15 DIAGNOSIS — J301 Allergic rhinitis due to pollen: Secondary | ICD-10-CM | POA: Diagnosis not present

## 2018-10-22 DIAGNOSIS — J3089 Other allergic rhinitis: Secondary | ICD-10-CM | POA: Diagnosis not present

## 2018-10-22 DIAGNOSIS — J301 Allergic rhinitis due to pollen: Secondary | ICD-10-CM | POA: Diagnosis not present

## 2018-10-22 DIAGNOSIS — J3081 Allergic rhinitis due to animal (cat) (dog) hair and dander: Secondary | ICD-10-CM | POA: Diagnosis not present

## 2018-10-25 DIAGNOSIS — Z0000 Encounter for general adult medical examination without abnormal findings: Secondary | ICD-10-CM | POA: Diagnosis not present

## 2018-10-25 DIAGNOSIS — E7800 Pure hypercholesterolemia, unspecified: Secondary | ICD-10-CM | POA: Diagnosis not present

## 2018-10-25 DIAGNOSIS — G4700 Insomnia, unspecified: Secondary | ICD-10-CM | POA: Diagnosis not present

## 2018-10-29 DIAGNOSIS — J3081 Allergic rhinitis due to animal (cat) (dog) hair and dander: Secondary | ICD-10-CM | POA: Diagnosis not present

## 2018-10-29 DIAGNOSIS — J3089 Other allergic rhinitis: Secondary | ICD-10-CM | POA: Diagnosis not present

## 2018-10-29 DIAGNOSIS — J301 Allergic rhinitis due to pollen: Secondary | ICD-10-CM | POA: Diagnosis not present

## 2018-10-31 DIAGNOSIS — J301 Allergic rhinitis due to pollen: Secondary | ICD-10-CM | POA: Diagnosis not present

## 2018-10-31 DIAGNOSIS — J3081 Allergic rhinitis due to animal (cat) (dog) hair and dander: Secondary | ICD-10-CM | POA: Diagnosis not present

## 2018-10-31 DIAGNOSIS — J3089 Other allergic rhinitis: Secondary | ICD-10-CM | POA: Diagnosis not present

## 2018-11-05 DIAGNOSIS — J3089 Other allergic rhinitis: Secondary | ICD-10-CM | POA: Diagnosis not present

## 2018-11-05 DIAGNOSIS — J301 Allergic rhinitis due to pollen: Secondary | ICD-10-CM | POA: Diagnosis not present

## 2018-11-05 DIAGNOSIS — J3081 Allergic rhinitis due to animal (cat) (dog) hair and dander: Secondary | ICD-10-CM | POA: Diagnosis not present

## 2018-11-08 DIAGNOSIS — J3089 Other allergic rhinitis: Secondary | ICD-10-CM | POA: Diagnosis not present

## 2018-11-08 DIAGNOSIS — J301 Allergic rhinitis due to pollen: Secondary | ICD-10-CM | POA: Diagnosis not present

## 2018-11-08 DIAGNOSIS — J3081 Allergic rhinitis due to animal (cat) (dog) hair and dander: Secondary | ICD-10-CM | POA: Diagnosis not present

## 2018-11-13 DIAGNOSIS — J3089 Other allergic rhinitis: Secondary | ICD-10-CM | POA: Diagnosis not present

## 2018-11-13 DIAGNOSIS — J3081 Allergic rhinitis due to animal (cat) (dog) hair and dander: Secondary | ICD-10-CM | POA: Diagnosis not present

## 2018-11-13 DIAGNOSIS — J301 Allergic rhinitis due to pollen: Secondary | ICD-10-CM | POA: Diagnosis not present

## 2018-11-21 DIAGNOSIS — J3089 Other allergic rhinitis: Secondary | ICD-10-CM | POA: Diagnosis not present

## 2018-11-21 DIAGNOSIS — J3081 Allergic rhinitis due to animal (cat) (dog) hair and dander: Secondary | ICD-10-CM | POA: Diagnosis not present

## 2018-11-21 DIAGNOSIS — J301 Allergic rhinitis due to pollen: Secondary | ICD-10-CM | POA: Diagnosis not present

## 2018-11-26 DIAGNOSIS — J3081 Allergic rhinitis due to animal (cat) (dog) hair and dander: Secondary | ICD-10-CM | POA: Diagnosis not present

## 2018-11-26 DIAGNOSIS — J301 Allergic rhinitis due to pollen: Secondary | ICD-10-CM | POA: Diagnosis not present

## 2018-11-26 DIAGNOSIS — J3089 Other allergic rhinitis: Secondary | ICD-10-CM | POA: Diagnosis not present

## 2018-12-03 DIAGNOSIS — J3081 Allergic rhinitis due to animal (cat) (dog) hair and dander: Secondary | ICD-10-CM | POA: Diagnosis not present

## 2018-12-03 DIAGNOSIS — J3089 Other allergic rhinitis: Secondary | ICD-10-CM | POA: Diagnosis not present

## 2018-12-03 DIAGNOSIS — J301 Allergic rhinitis due to pollen: Secondary | ICD-10-CM | POA: Diagnosis not present

## 2018-12-11 DIAGNOSIS — J301 Allergic rhinitis due to pollen: Secondary | ICD-10-CM | POA: Diagnosis not present

## 2018-12-11 DIAGNOSIS — J3081 Allergic rhinitis due to animal (cat) (dog) hair and dander: Secondary | ICD-10-CM | POA: Diagnosis not present

## 2018-12-11 DIAGNOSIS — J3089 Other allergic rhinitis: Secondary | ICD-10-CM | POA: Diagnosis not present

## 2018-12-17 DIAGNOSIS — J3089 Other allergic rhinitis: Secondary | ICD-10-CM | POA: Diagnosis not present

## 2018-12-17 DIAGNOSIS — J301 Allergic rhinitis due to pollen: Secondary | ICD-10-CM | POA: Diagnosis not present

## 2018-12-17 DIAGNOSIS — J3081 Allergic rhinitis due to animal (cat) (dog) hair and dander: Secondary | ICD-10-CM | POA: Diagnosis not present

## 2018-12-24 DIAGNOSIS — J301 Allergic rhinitis due to pollen: Secondary | ICD-10-CM | POA: Diagnosis not present

## 2018-12-24 DIAGNOSIS — J3089 Other allergic rhinitis: Secondary | ICD-10-CM | POA: Diagnosis not present

## 2018-12-24 DIAGNOSIS — J3081 Allergic rhinitis due to animal (cat) (dog) hair and dander: Secondary | ICD-10-CM | POA: Diagnosis not present

## 2018-12-31 DIAGNOSIS — J3081 Allergic rhinitis due to animal (cat) (dog) hair and dander: Secondary | ICD-10-CM | POA: Diagnosis not present

## 2018-12-31 DIAGNOSIS — J3089 Other allergic rhinitis: Secondary | ICD-10-CM | POA: Diagnosis not present

## 2018-12-31 DIAGNOSIS — J301 Allergic rhinitis due to pollen: Secondary | ICD-10-CM | POA: Diagnosis not present

## 2019-01-08 DIAGNOSIS — J3089 Other allergic rhinitis: Secondary | ICD-10-CM | POA: Diagnosis not present

## 2019-01-08 DIAGNOSIS — J301 Allergic rhinitis due to pollen: Secondary | ICD-10-CM | POA: Diagnosis not present

## 2019-01-08 DIAGNOSIS — J3081 Allergic rhinitis due to animal (cat) (dog) hair and dander: Secondary | ICD-10-CM | POA: Diagnosis not present

## 2019-01-11 DIAGNOSIS — R001 Bradycardia, unspecified: Secondary | ICD-10-CM | POA: Diagnosis not present

## 2019-01-11 DIAGNOSIS — I10 Essential (primary) hypertension: Secondary | ICD-10-CM | POA: Diagnosis not present

## 2019-01-15 DIAGNOSIS — J301 Allergic rhinitis due to pollen: Secondary | ICD-10-CM | POA: Diagnosis not present

## 2019-01-15 DIAGNOSIS — J3089 Other allergic rhinitis: Secondary | ICD-10-CM | POA: Diagnosis not present

## 2019-01-15 DIAGNOSIS — J3081 Allergic rhinitis due to animal (cat) (dog) hair and dander: Secondary | ICD-10-CM | POA: Diagnosis not present

## 2019-01-22 DIAGNOSIS — J3081 Allergic rhinitis due to animal (cat) (dog) hair and dander: Secondary | ICD-10-CM | POA: Diagnosis not present

## 2019-01-22 DIAGNOSIS — J3089 Other allergic rhinitis: Secondary | ICD-10-CM | POA: Diagnosis not present

## 2019-01-22 DIAGNOSIS — J301 Allergic rhinitis due to pollen: Secondary | ICD-10-CM | POA: Diagnosis not present

## 2019-01-28 ENCOUNTER — Ambulatory Visit (INDEPENDENT_AMBULATORY_CARE_PROVIDER_SITE_OTHER): Payer: Medicare Other | Admitting: Otolaryngology

## 2019-01-28 DIAGNOSIS — R42 Dizziness and giddiness: Secondary | ICD-10-CM | POA: Diagnosis not present

## 2019-01-28 DIAGNOSIS — H903 Sensorineural hearing loss, bilateral: Secondary | ICD-10-CM | POA: Diagnosis not present

## 2019-01-28 DIAGNOSIS — J3081 Allergic rhinitis due to animal (cat) (dog) hair and dander: Secondary | ICD-10-CM | POA: Diagnosis not present

## 2019-01-28 DIAGNOSIS — J301 Allergic rhinitis due to pollen: Secondary | ICD-10-CM | POA: Diagnosis not present

## 2019-01-28 DIAGNOSIS — H9222 Otorrhagia, left ear: Secondary | ICD-10-CM

## 2019-01-28 DIAGNOSIS — J3089 Other allergic rhinitis: Secondary | ICD-10-CM | POA: Diagnosis not present

## 2019-02-04 DIAGNOSIS — J301 Allergic rhinitis due to pollen: Secondary | ICD-10-CM | POA: Diagnosis not present

## 2019-02-04 DIAGNOSIS — J3081 Allergic rhinitis due to animal (cat) (dog) hair and dander: Secondary | ICD-10-CM | POA: Diagnosis not present

## 2019-02-04 DIAGNOSIS — J3089 Other allergic rhinitis: Secondary | ICD-10-CM | POA: Diagnosis not present

## 2019-02-11 DIAGNOSIS — J3089 Other allergic rhinitis: Secondary | ICD-10-CM | POA: Diagnosis not present

## 2019-02-11 DIAGNOSIS — J3081 Allergic rhinitis due to animal (cat) (dog) hair and dander: Secondary | ICD-10-CM | POA: Diagnosis not present

## 2019-02-11 DIAGNOSIS — J301 Allergic rhinitis due to pollen: Secondary | ICD-10-CM | POA: Diagnosis not present

## 2019-02-25 DIAGNOSIS — J3089 Other allergic rhinitis: Secondary | ICD-10-CM | POA: Diagnosis not present

## 2019-02-25 DIAGNOSIS — J3081 Allergic rhinitis due to animal (cat) (dog) hair and dander: Secondary | ICD-10-CM | POA: Diagnosis not present

## 2019-02-25 DIAGNOSIS — J301 Allergic rhinitis due to pollen: Secondary | ICD-10-CM | POA: Diagnosis not present

## 2019-02-27 DIAGNOSIS — H838X3 Other specified diseases of inner ear, bilateral: Secondary | ICD-10-CM | POA: Diagnosis not present

## 2019-02-27 DIAGNOSIS — H903 Sensorineural hearing loss, bilateral: Secondary | ICD-10-CM | POA: Diagnosis not present

## 2019-02-28 DIAGNOSIS — J301 Allergic rhinitis due to pollen: Secondary | ICD-10-CM | POA: Diagnosis not present

## 2019-02-28 DIAGNOSIS — J3081 Allergic rhinitis due to animal (cat) (dog) hair and dander: Secondary | ICD-10-CM | POA: Diagnosis not present

## 2019-02-28 DIAGNOSIS — J3089 Other allergic rhinitis: Secondary | ICD-10-CM | POA: Diagnosis not present

## 2019-03-04 DIAGNOSIS — J301 Allergic rhinitis due to pollen: Secondary | ICD-10-CM | POA: Diagnosis not present

## 2019-03-04 DIAGNOSIS — J3081 Allergic rhinitis due to animal (cat) (dog) hair and dander: Secondary | ICD-10-CM | POA: Diagnosis not present

## 2019-03-04 DIAGNOSIS — J3089 Other allergic rhinitis: Secondary | ICD-10-CM | POA: Diagnosis not present

## 2019-03-11 DIAGNOSIS — J301 Allergic rhinitis due to pollen: Secondary | ICD-10-CM | POA: Diagnosis not present

## 2019-03-11 DIAGNOSIS — J3081 Allergic rhinitis due to animal (cat) (dog) hair and dander: Secondary | ICD-10-CM | POA: Diagnosis not present

## 2019-03-11 DIAGNOSIS — J3089 Other allergic rhinitis: Secondary | ICD-10-CM | POA: Diagnosis not present

## 2019-03-18 DIAGNOSIS — J3081 Allergic rhinitis due to animal (cat) (dog) hair and dander: Secondary | ICD-10-CM | POA: Diagnosis not present

## 2019-03-18 DIAGNOSIS — J301 Allergic rhinitis due to pollen: Secondary | ICD-10-CM | POA: Diagnosis not present

## 2019-03-18 DIAGNOSIS — J3089 Other allergic rhinitis: Secondary | ICD-10-CM | POA: Diagnosis not present

## 2019-03-20 DIAGNOSIS — J3089 Other allergic rhinitis: Secondary | ICD-10-CM | POA: Diagnosis not present

## 2019-03-20 DIAGNOSIS — J3081 Allergic rhinitis due to animal (cat) (dog) hair and dander: Secondary | ICD-10-CM | POA: Diagnosis not present

## 2019-03-20 DIAGNOSIS — J301 Allergic rhinitis due to pollen: Secondary | ICD-10-CM | POA: Diagnosis not present

## 2019-03-21 DIAGNOSIS — Z23 Encounter for immunization: Secondary | ICD-10-CM | POA: Diagnosis not present

## 2019-03-26 DIAGNOSIS — J301 Allergic rhinitis due to pollen: Secondary | ICD-10-CM | POA: Diagnosis not present

## 2019-03-26 DIAGNOSIS — J3089 Other allergic rhinitis: Secondary | ICD-10-CM | POA: Diagnosis not present

## 2019-03-26 DIAGNOSIS — J3081 Allergic rhinitis due to animal (cat) (dog) hair and dander: Secondary | ICD-10-CM | POA: Diagnosis not present

## 2019-03-28 DIAGNOSIS — J3089 Other allergic rhinitis: Secondary | ICD-10-CM | POA: Diagnosis not present

## 2019-03-28 DIAGNOSIS — J301 Allergic rhinitis due to pollen: Secondary | ICD-10-CM | POA: Diagnosis not present

## 2019-03-28 DIAGNOSIS — J3081 Allergic rhinitis due to animal (cat) (dog) hair and dander: Secondary | ICD-10-CM | POA: Diagnosis not present

## 2019-04-01 DIAGNOSIS — J3081 Allergic rhinitis due to animal (cat) (dog) hair and dander: Secondary | ICD-10-CM | POA: Diagnosis not present

## 2019-04-01 DIAGNOSIS — J301 Allergic rhinitis due to pollen: Secondary | ICD-10-CM | POA: Diagnosis not present

## 2019-04-01 DIAGNOSIS — J3089 Other allergic rhinitis: Secondary | ICD-10-CM | POA: Diagnosis not present

## 2019-04-08 DIAGNOSIS — J3081 Allergic rhinitis due to animal (cat) (dog) hair and dander: Secondary | ICD-10-CM | POA: Diagnosis not present

## 2019-04-08 DIAGNOSIS — J3089 Other allergic rhinitis: Secondary | ICD-10-CM | POA: Diagnosis not present

## 2019-04-08 DIAGNOSIS — J301 Allergic rhinitis due to pollen: Secondary | ICD-10-CM | POA: Diagnosis not present

## 2019-04-15 DIAGNOSIS — J301 Allergic rhinitis due to pollen: Secondary | ICD-10-CM | POA: Diagnosis not present

## 2019-04-15 DIAGNOSIS — J3081 Allergic rhinitis due to animal (cat) (dog) hair and dander: Secondary | ICD-10-CM | POA: Diagnosis not present

## 2019-04-15 DIAGNOSIS — J3089 Other allergic rhinitis: Secondary | ICD-10-CM | POA: Diagnosis not present

## 2019-04-22 DIAGNOSIS — J301 Allergic rhinitis due to pollen: Secondary | ICD-10-CM | POA: Diagnosis not present

## 2019-04-22 DIAGNOSIS — J3089 Other allergic rhinitis: Secondary | ICD-10-CM | POA: Diagnosis not present

## 2019-04-22 DIAGNOSIS — J3081 Allergic rhinitis due to animal (cat) (dog) hair and dander: Secondary | ICD-10-CM | POA: Diagnosis not present

## 2019-04-30 DIAGNOSIS — J301 Allergic rhinitis due to pollen: Secondary | ICD-10-CM | POA: Diagnosis not present

## 2019-04-30 DIAGNOSIS — J3089 Other allergic rhinitis: Secondary | ICD-10-CM | POA: Diagnosis not present

## 2019-04-30 DIAGNOSIS — J3081 Allergic rhinitis due to animal (cat) (dog) hair and dander: Secondary | ICD-10-CM | POA: Diagnosis not present

## 2019-05-06 DIAGNOSIS — J3089 Other allergic rhinitis: Secondary | ICD-10-CM | POA: Diagnosis not present

## 2019-05-06 DIAGNOSIS — J3081 Allergic rhinitis due to animal (cat) (dog) hair and dander: Secondary | ICD-10-CM | POA: Diagnosis not present

## 2019-05-06 DIAGNOSIS — J301 Allergic rhinitis due to pollen: Secondary | ICD-10-CM | POA: Diagnosis not present

## 2019-05-13 DIAGNOSIS — J3081 Allergic rhinitis due to animal (cat) (dog) hair and dander: Secondary | ICD-10-CM | POA: Diagnosis not present

## 2019-05-13 DIAGNOSIS — J301 Allergic rhinitis due to pollen: Secondary | ICD-10-CM | POA: Diagnosis not present

## 2019-05-13 DIAGNOSIS — J3089 Other allergic rhinitis: Secondary | ICD-10-CM | POA: Diagnosis not present

## 2019-05-20 DIAGNOSIS — J3081 Allergic rhinitis due to animal (cat) (dog) hair and dander: Secondary | ICD-10-CM | POA: Diagnosis not present

## 2019-05-20 DIAGNOSIS — J301 Allergic rhinitis due to pollen: Secondary | ICD-10-CM | POA: Diagnosis not present

## 2019-05-20 DIAGNOSIS — J3089 Other allergic rhinitis: Secondary | ICD-10-CM | POA: Diagnosis not present

## 2019-05-27 DIAGNOSIS — J301 Allergic rhinitis due to pollen: Secondary | ICD-10-CM | POA: Diagnosis not present

## 2019-05-27 DIAGNOSIS — J3081 Allergic rhinitis due to animal (cat) (dog) hair and dander: Secondary | ICD-10-CM | POA: Diagnosis not present

## 2019-05-27 DIAGNOSIS — J3089 Other allergic rhinitis: Secondary | ICD-10-CM | POA: Diagnosis not present

## 2019-06-03 DIAGNOSIS — J301 Allergic rhinitis due to pollen: Secondary | ICD-10-CM | POA: Diagnosis not present

## 2019-06-03 DIAGNOSIS — J3089 Other allergic rhinitis: Secondary | ICD-10-CM | POA: Diagnosis not present

## 2019-06-03 DIAGNOSIS — J3081 Allergic rhinitis due to animal (cat) (dog) hair and dander: Secondary | ICD-10-CM | POA: Diagnosis not present

## 2019-06-10 DIAGNOSIS — J3089 Other allergic rhinitis: Secondary | ICD-10-CM | POA: Diagnosis not present

## 2019-06-10 DIAGNOSIS — J301 Allergic rhinitis due to pollen: Secondary | ICD-10-CM | POA: Diagnosis not present

## 2019-06-10 DIAGNOSIS — J3081 Allergic rhinitis due to animal (cat) (dog) hair and dander: Secondary | ICD-10-CM | POA: Diagnosis not present

## 2019-06-17 DIAGNOSIS — J3081 Allergic rhinitis due to animal (cat) (dog) hair and dander: Secondary | ICD-10-CM | POA: Diagnosis not present

## 2019-06-17 DIAGNOSIS — J3089 Other allergic rhinitis: Secondary | ICD-10-CM | POA: Diagnosis not present

## 2019-06-17 DIAGNOSIS — J301 Allergic rhinitis due to pollen: Secondary | ICD-10-CM | POA: Diagnosis not present

## 2019-06-24 DIAGNOSIS — J301 Allergic rhinitis due to pollen: Secondary | ICD-10-CM | POA: Diagnosis not present

## 2019-06-24 DIAGNOSIS — J3081 Allergic rhinitis due to animal (cat) (dog) hair and dander: Secondary | ICD-10-CM | POA: Diagnosis not present

## 2019-06-24 DIAGNOSIS — J3089 Other allergic rhinitis: Secondary | ICD-10-CM | POA: Diagnosis not present

## 2019-07-29 DIAGNOSIS — J3081 Allergic rhinitis due to animal (cat) (dog) hair and dander: Secondary | ICD-10-CM | POA: Diagnosis not present

## 2019-07-29 DIAGNOSIS — J301 Allergic rhinitis due to pollen: Secondary | ICD-10-CM | POA: Diagnosis not present

## 2019-07-29 DIAGNOSIS — J3089 Other allergic rhinitis: Secondary | ICD-10-CM | POA: Diagnosis not present

## 2019-07-31 DIAGNOSIS — J3089 Other allergic rhinitis: Secondary | ICD-10-CM | POA: Diagnosis not present

## 2019-07-31 DIAGNOSIS — J301 Allergic rhinitis due to pollen: Secondary | ICD-10-CM | POA: Diagnosis not present

## 2019-07-31 DIAGNOSIS — J3081 Allergic rhinitis due to animal (cat) (dog) hair and dander: Secondary | ICD-10-CM | POA: Diagnosis not present

## 2019-08-06 ENCOUNTER — Ambulatory Visit: Payer: Medicare Other | Attending: Internal Medicine

## 2019-08-06 DIAGNOSIS — Z23 Encounter for immunization: Secondary | ICD-10-CM | POA: Diagnosis not present

## 2019-08-06 DIAGNOSIS — H2513 Age-related nuclear cataract, bilateral: Secondary | ICD-10-CM | POA: Diagnosis not present

## 2019-08-06 NOTE — Progress Notes (Signed)
   Covid-19 Vaccination Clinic  Name:  Kenneth Cruz    MRN: [id number] DOB: [birth year]  08/06/2019  Kenneth Cruz was observed post Covid-19 immunization for 15 minutes without incidence. He was provided with Vaccine Information Sheet and instruction to access the V-Safe system.   Kenneth Cruz was instructed to call 911 with any severe reactions post vaccine: Marland Kitchen Difficulty breathing  . Swelling of your face and throat  . A fast heartbeat  . A bad rash all over your body  . Dizziness and weakness    Immunizations Administered    Name Date Dose VIS Date Route   Pfizer COVID-19 Vaccine 08/06/2019  9:45 AM 0.3 mL 06/28/2019 Intramuscular   Manufacturer: [employer], [employer]   Lot: [id number]   NDC: [address]-5

## 2019-08-12 DIAGNOSIS — J3089 Other allergic rhinitis: Secondary | ICD-10-CM | POA: Diagnosis not present

## 2019-08-12 DIAGNOSIS — J301 Allergic rhinitis due to pollen: Secondary | ICD-10-CM | POA: Diagnosis not present

## 2019-08-12 DIAGNOSIS — J3081 Allergic rhinitis due to animal (cat) (dog) hair and dander: Secondary | ICD-10-CM | POA: Diagnosis not present

## 2019-08-14 DIAGNOSIS — K130 Diseases of lips: Secondary | ICD-10-CM | POA: Diagnosis not present

## 2019-08-19 DIAGNOSIS — J301 Allergic rhinitis due to pollen: Secondary | ICD-10-CM | POA: Diagnosis not present

## 2019-08-19 DIAGNOSIS — J3089 Other allergic rhinitis: Secondary | ICD-10-CM | POA: Diagnosis not present

## 2019-08-19 DIAGNOSIS — J3081 Allergic rhinitis due to animal (cat) (dog) hair and dander: Secondary | ICD-10-CM | POA: Diagnosis not present

## 2019-08-20 DIAGNOSIS — R0982 Postnasal drip: Secondary | ICD-10-CM | POA: Diagnosis not present

## 2019-08-22 DIAGNOSIS — J3081 Allergic rhinitis due to animal (cat) (dog) hair and dander: Secondary | ICD-10-CM | POA: Diagnosis not present

## 2019-08-22 DIAGNOSIS — J3089 Other allergic rhinitis: Secondary | ICD-10-CM | POA: Diagnosis not present

## 2019-08-22 DIAGNOSIS — J301 Allergic rhinitis due to pollen: Secondary | ICD-10-CM | POA: Diagnosis not present

## 2019-08-26 DIAGNOSIS — J301 Allergic rhinitis due to pollen: Secondary | ICD-10-CM | POA: Diagnosis not present

## 2019-08-26 DIAGNOSIS — J3089 Other allergic rhinitis: Secondary | ICD-10-CM | POA: Diagnosis not present

## 2019-08-26 DIAGNOSIS — J3081 Allergic rhinitis due to animal (cat) (dog) hair and dander: Secondary | ICD-10-CM | POA: Diagnosis not present

## 2019-08-27 ENCOUNTER — Ambulatory Visit: Payer: Medicare Other | Attending: Internal Medicine

## 2019-08-27 DIAGNOSIS — Z23 Encounter for immunization: Secondary | ICD-10-CM

## 2019-08-27 NOTE — Progress Notes (Signed)
   Covid-19 Vaccination Clinic  Name:  Kenneth Cruz    MRN: [id number] DOB: 02-Sep-1950  08/27/2019  [name] was observed post Covid-19 immunization for 15 minutes without incidence. He was provided with Vaccine Information Sheet and instruction to access the V-Safe system.   [name] was instructed to call 911 with any severe reactions post vaccine: Marland Kitchen Difficulty breathing  . Swelling of your face and throat  . A fast heartbeat  . A bad rash all over your body  . Dizziness and weakness    Immunizations Administered    Name Date Dose VIS Date Route   Pfizer COVID-19 Vaccine 08/27/2019  9:26 AM 0.3 mL 06/28/2019 Intramuscular   Manufacturer: [employer], [employer]   Lot: [id number]   NDC: [address]-0

## 2019-09-02 DIAGNOSIS — J301 Allergic rhinitis due to pollen: Secondary | ICD-10-CM | POA: Diagnosis not present

## 2019-09-02 DIAGNOSIS — J3081 Allergic rhinitis due to animal (cat) (dog) hair and dander: Secondary | ICD-10-CM | POA: Diagnosis not present

## 2019-09-02 DIAGNOSIS — J3089 Other allergic rhinitis: Secondary | ICD-10-CM | POA: Diagnosis not present

## 2019-09-06 DIAGNOSIS — J3089 Other allergic rhinitis: Secondary | ICD-10-CM | POA: Diagnosis not present

## 2019-09-06 DIAGNOSIS — J3081 Allergic rhinitis due to animal (cat) (dog) hair and dander: Secondary | ICD-10-CM | POA: Diagnosis not present

## 2019-09-06 DIAGNOSIS — J301 Allergic rhinitis due to pollen: Secondary | ICD-10-CM | POA: Diagnosis not present

## 2019-09-11 DIAGNOSIS — J3089 Other allergic rhinitis: Secondary | ICD-10-CM | POA: Diagnosis not present

## 2019-09-11 DIAGNOSIS — J3081 Allergic rhinitis due to animal (cat) (dog) hair and dander: Secondary | ICD-10-CM | POA: Diagnosis not present

## 2019-09-11 DIAGNOSIS — J301 Allergic rhinitis due to pollen: Secondary | ICD-10-CM | POA: Diagnosis not present

## 2019-09-16 DIAGNOSIS — J3089 Other allergic rhinitis: Secondary | ICD-10-CM | POA: Diagnosis not present

## 2019-09-16 DIAGNOSIS — J3081 Allergic rhinitis due to animal (cat) (dog) hair and dander: Secondary | ICD-10-CM | POA: Diagnosis not present

## 2019-09-16 DIAGNOSIS — J301 Allergic rhinitis due to pollen: Secondary | ICD-10-CM | POA: Diagnosis not present

## 2019-09-23 DIAGNOSIS — J3081 Allergic rhinitis due to animal (cat) (dog) hair and dander: Secondary | ICD-10-CM | POA: Diagnosis not present

## 2019-09-23 DIAGNOSIS — J301 Allergic rhinitis due to pollen: Secondary | ICD-10-CM | POA: Diagnosis not present

## 2019-09-23 DIAGNOSIS — J3089 Other allergic rhinitis: Secondary | ICD-10-CM | POA: Diagnosis not present

## 2019-09-30 DIAGNOSIS — J3081 Allergic rhinitis due to animal (cat) (dog) hair and dander: Secondary | ICD-10-CM | POA: Diagnosis not present

## 2019-09-30 DIAGNOSIS — J301 Allergic rhinitis due to pollen: Secondary | ICD-10-CM | POA: Diagnosis not present

## 2019-09-30 DIAGNOSIS — J3089 Other allergic rhinitis: Secondary | ICD-10-CM | POA: Diagnosis not present

## 2019-10-07 DIAGNOSIS — J3089 Other allergic rhinitis: Secondary | ICD-10-CM | POA: Diagnosis not present

## 2019-10-07 DIAGNOSIS — J3081 Allergic rhinitis due to animal (cat) (dog) hair and dander: Secondary | ICD-10-CM | POA: Diagnosis not present

## 2019-10-07 DIAGNOSIS — J301 Allergic rhinitis due to pollen: Secondary | ICD-10-CM | POA: Diagnosis not present

## 2019-10-14 DIAGNOSIS — J3081 Allergic rhinitis due to animal (cat) (dog) hair and dander: Secondary | ICD-10-CM | POA: Diagnosis not present

## 2019-10-14 DIAGNOSIS — J3089 Other allergic rhinitis: Secondary | ICD-10-CM | POA: Diagnosis not present

## 2019-10-14 DIAGNOSIS — J301 Allergic rhinitis due to pollen: Secondary | ICD-10-CM | POA: Diagnosis not present

## 2019-10-21 DIAGNOSIS — J301 Allergic rhinitis due to pollen: Secondary | ICD-10-CM | POA: Diagnosis not present

## 2019-10-21 DIAGNOSIS — J3081 Allergic rhinitis due to animal (cat) (dog) hair and dander: Secondary | ICD-10-CM | POA: Diagnosis not present

## 2019-10-21 DIAGNOSIS — J3089 Other allergic rhinitis: Secondary | ICD-10-CM | POA: Diagnosis not present

## 2019-10-24 DIAGNOSIS — Z125 Encounter for screening for malignant neoplasm of prostate: Secondary | ICD-10-CM | POA: Diagnosis not present

## 2019-10-24 DIAGNOSIS — Z0000 Encounter for general adult medical examination without abnormal findings: Secondary | ICD-10-CM | POA: Diagnosis not present

## 2019-10-24 DIAGNOSIS — Z136 Encounter for screening for cardiovascular disorders: Secondary | ICD-10-CM | POA: Diagnosis not present

## 2019-10-28 DIAGNOSIS — R202 Paresthesia of skin: Secondary | ICD-10-CM | POA: Diagnosis not present

## 2019-10-28 DIAGNOSIS — Z0000 Encounter for general adult medical examination without abnormal findings: Secondary | ICD-10-CM | POA: Diagnosis not present

## 2019-10-28 DIAGNOSIS — J3089 Other allergic rhinitis: Secondary | ICD-10-CM | POA: Diagnosis not present

## 2019-10-28 DIAGNOSIS — R946 Abnormal results of thyroid function studies: Secondary | ICD-10-CM | POA: Diagnosis not present

## 2019-10-28 DIAGNOSIS — J301 Allergic rhinitis due to pollen: Secondary | ICD-10-CM | POA: Diagnosis not present

## 2019-10-28 DIAGNOSIS — J3081 Allergic rhinitis due to animal (cat) (dog) hair and dander: Secondary | ICD-10-CM | POA: Diagnosis not present

## 2019-10-29 DIAGNOSIS — M542 Cervicalgia: Secondary | ICD-10-CM | POA: Diagnosis not present

## 2019-10-29 DIAGNOSIS — R208 Other disturbances of skin sensation: Secondary | ICD-10-CM | POA: Diagnosis not present

## 2019-10-29 DIAGNOSIS — M545 Low back pain: Secondary | ICD-10-CM | POA: Diagnosis not present

## 2019-10-30 DIAGNOSIS — K130 Diseases of lips: Secondary | ICD-10-CM | POA: Diagnosis not present

## 2019-11-04 DIAGNOSIS — J301 Allergic rhinitis due to pollen: Secondary | ICD-10-CM | POA: Diagnosis not present

## 2019-11-04 DIAGNOSIS — J3081 Allergic rhinitis due to animal (cat) (dog) hair and dander: Secondary | ICD-10-CM | POA: Diagnosis not present

## 2019-11-04 DIAGNOSIS — J3089 Other allergic rhinitis: Secondary | ICD-10-CM | POA: Diagnosis not present

## 2019-11-06 DIAGNOSIS — M542 Cervicalgia: Secondary | ICD-10-CM | POA: Diagnosis not present

## 2019-11-06 DIAGNOSIS — M50321 Other cervical disc degeneration at C4-C5 level: Secondary | ICD-10-CM | POA: Diagnosis not present

## 2019-11-06 DIAGNOSIS — M50323 Other cervical disc degeneration at C6-C7 level: Secondary | ICD-10-CM | POA: Diagnosis not present

## 2019-11-06 DIAGNOSIS — M50322 Other cervical disc degeneration at C5-C6 level: Secondary | ICD-10-CM | POA: Diagnosis not present

## 2019-11-11 DIAGNOSIS — J301 Allergic rhinitis due to pollen: Secondary | ICD-10-CM | POA: Diagnosis not present

## 2019-11-11 DIAGNOSIS — J3089 Other allergic rhinitis: Secondary | ICD-10-CM | POA: Diagnosis not present

## 2019-11-11 DIAGNOSIS — J3081 Allergic rhinitis due to animal (cat) (dog) hair and dander: Secondary | ICD-10-CM | POA: Diagnosis not present

## 2019-11-14 DIAGNOSIS — R202 Paresthesia of skin: Secondary | ICD-10-CM | POA: Diagnosis not present

## 2019-11-18 DIAGNOSIS — J3081 Allergic rhinitis due to animal (cat) (dog) hair and dander: Secondary | ICD-10-CM | POA: Diagnosis not present

## 2019-11-18 DIAGNOSIS — J301 Allergic rhinitis due to pollen: Secondary | ICD-10-CM | POA: Diagnosis not present

## 2019-11-18 DIAGNOSIS — J3089 Other allergic rhinitis: Secondary | ICD-10-CM | POA: Diagnosis not present

## 2019-11-19 DIAGNOSIS — M542 Cervicalgia: Secondary | ICD-10-CM | POA: Diagnosis not present

## 2019-11-25 DIAGNOSIS — J3081 Allergic rhinitis due to animal (cat) (dog) hair and dander: Secondary | ICD-10-CM | POA: Diagnosis not present

## 2019-11-25 DIAGNOSIS — J301 Allergic rhinitis due to pollen: Secondary | ICD-10-CM | POA: Diagnosis not present

## 2019-11-25 DIAGNOSIS — J3089 Other allergic rhinitis: Secondary | ICD-10-CM | POA: Diagnosis not present

## 2019-12-02 DIAGNOSIS — J3089 Other allergic rhinitis: Secondary | ICD-10-CM | POA: Diagnosis not present

## 2019-12-02 DIAGNOSIS — J301 Allergic rhinitis due to pollen: Secondary | ICD-10-CM | POA: Diagnosis not present

## 2019-12-02 DIAGNOSIS — J3081 Allergic rhinitis due to animal (cat) (dog) hair and dander: Secondary | ICD-10-CM | POA: Diagnosis not present

## 2019-12-03 DIAGNOSIS — J3089 Other allergic rhinitis: Secondary | ICD-10-CM | POA: Diagnosis not present

## 2019-12-03 DIAGNOSIS — J3081 Allergic rhinitis due to animal (cat) (dog) hair and dander: Secondary | ICD-10-CM | POA: Diagnosis not present

## 2019-12-03 DIAGNOSIS — J301 Allergic rhinitis due to pollen: Secondary | ICD-10-CM | POA: Diagnosis not present

## 2019-12-09 DIAGNOSIS — J301 Allergic rhinitis due to pollen: Secondary | ICD-10-CM | POA: Diagnosis not present

## 2019-12-09 DIAGNOSIS — J3089 Other allergic rhinitis: Secondary | ICD-10-CM | POA: Diagnosis not present

## 2019-12-09 DIAGNOSIS — J3081 Allergic rhinitis due to animal (cat) (dog) hair and dander: Secondary | ICD-10-CM | POA: Diagnosis not present

## 2019-12-17 DIAGNOSIS — J301 Allergic rhinitis due to pollen: Secondary | ICD-10-CM | POA: Diagnosis not present

## 2019-12-17 DIAGNOSIS — J3089 Other allergic rhinitis: Secondary | ICD-10-CM | POA: Diagnosis not present

## 2019-12-17 DIAGNOSIS — J3081 Allergic rhinitis due to animal (cat) (dog) hair and dander: Secondary | ICD-10-CM | POA: Diagnosis not present

## 2019-12-24 DIAGNOSIS — J3089 Other allergic rhinitis: Secondary | ICD-10-CM | POA: Diagnosis not present

## 2019-12-24 DIAGNOSIS — J3081 Allergic rhinitis due to animal (cat) (dog) hair and dander: Secondary | ICD-10-CM | POA: Diagnosis not present

## 2019-12-24 DIAGNOSIS — J301 Allergic rhinitis due to pollen: Secondary | ICD-10-CM | POA: Diagnosis not present

## 2019-12-30 DIAGNOSIS — J3081 Allergic rhinitis due to animal (cat) (dog) hair and dander: Secondary | ICD-10-CM | POA: Diagnosis not present

## 2019-12-30 DIAGNOSIS — J3089 Other allergic rhinitis: Secondary | ICD-10-CM | POA: Diagnosis not present

## 2019-12-30 DIAGNOSIS — J301 Allergic rhinitis due to pollen: Secondary | ICD-10-CM | POA: Diagnosis not present

## 2020-01-05 IMAGING — CT CT MAXILLOFACIAL W/O CM
1 series · 16 of 30 positions shown, 20 images · non-contrast
Comparison: None.

CLINICAL DATA: Chronic sinusitis

EXAM:
CT MAXILLOFACIAL WITHOUT CONTRAST
TECHNIQUE: Multidetector CT images of the paranasal sinuses were obtained using
the standard protocol without intravenous contrast.

[Series 4: soft tissue · axial · 0.50mm/px · z∈[+41,+194]mm · 16 of 165 slices shown, 20 images]
[im 6/165  brain]
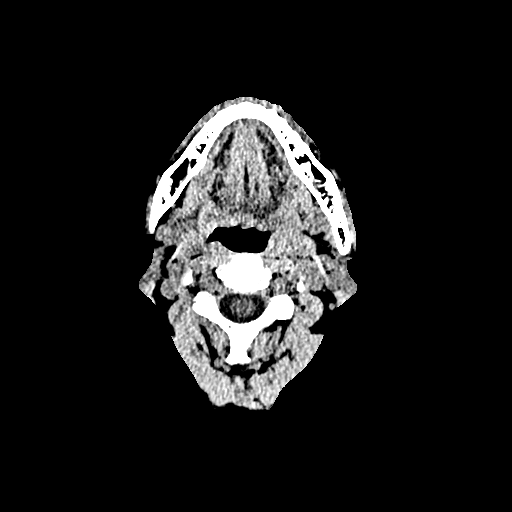
[im 6/165  bone]
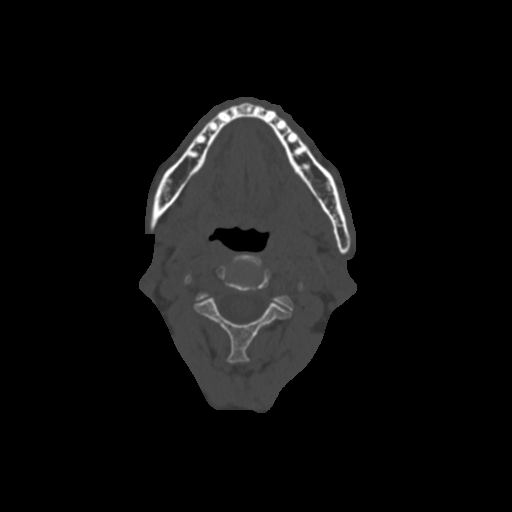
[im 17/165  bone]
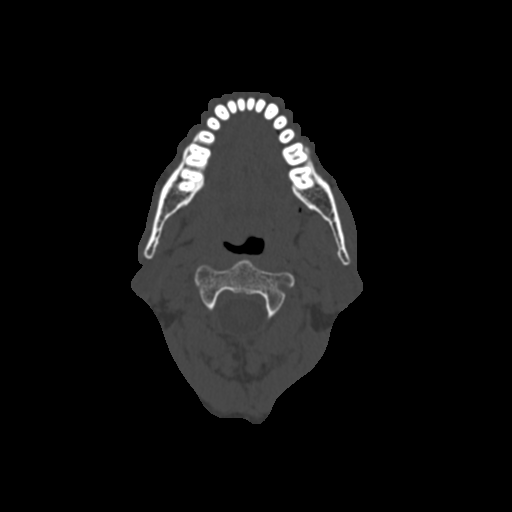
[im 29/165  bone]
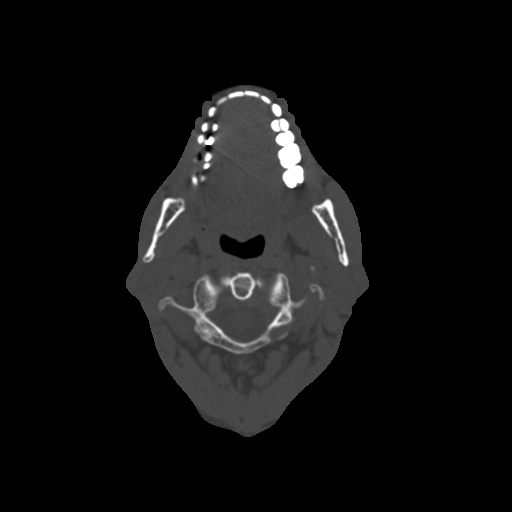
[im 40/165  bone]
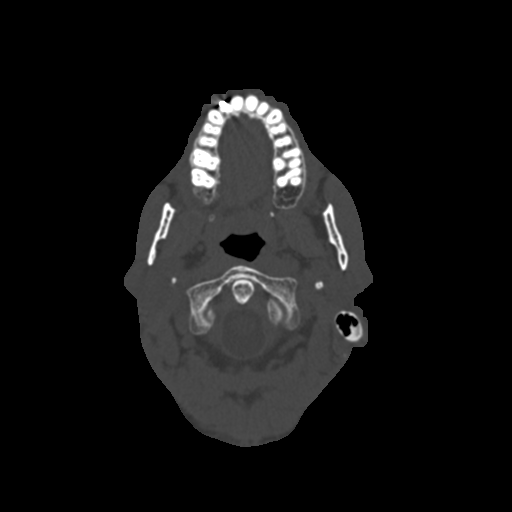
[im 46/165  brain]
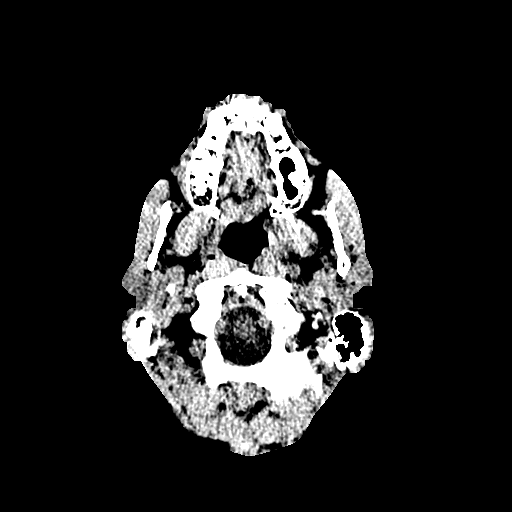
[im 46/165  bone]
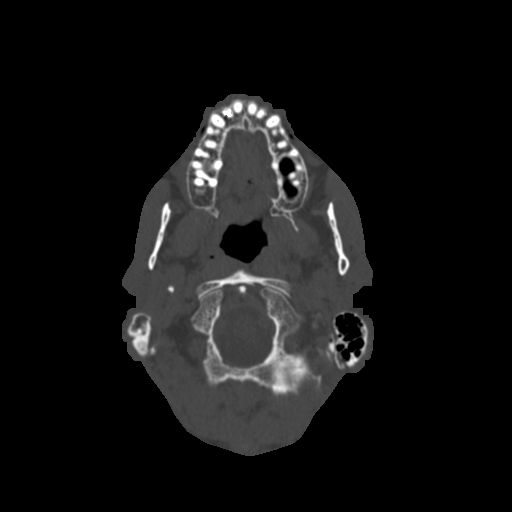
[im 57/165  bone]
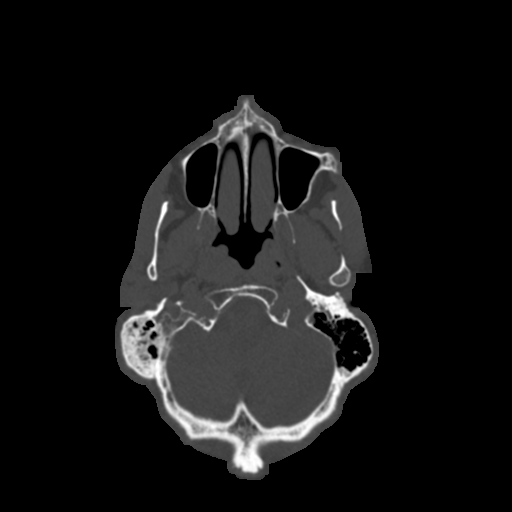
[im 68/165  bone]
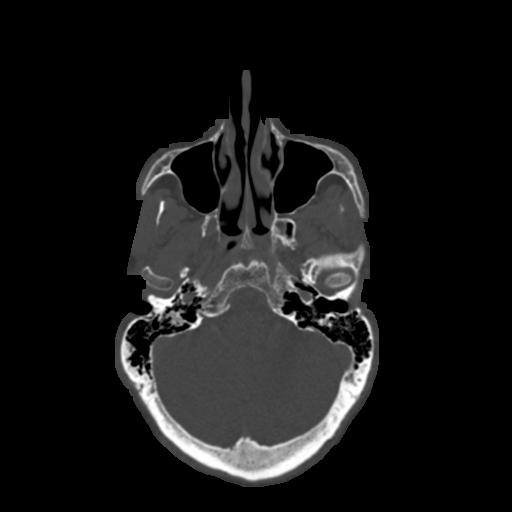
[im 80/165  bone]
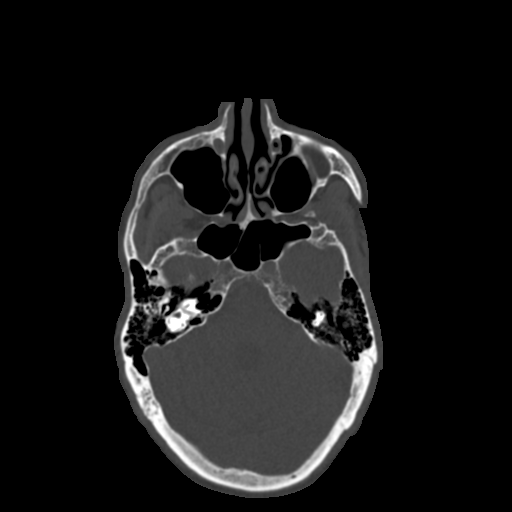
[im 85/165  brain]
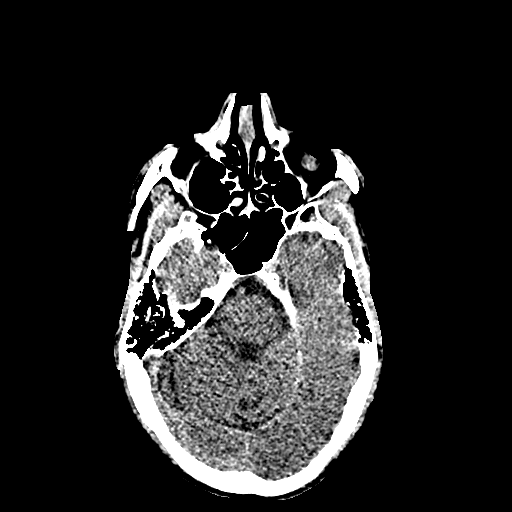
[im 85/165  bone]
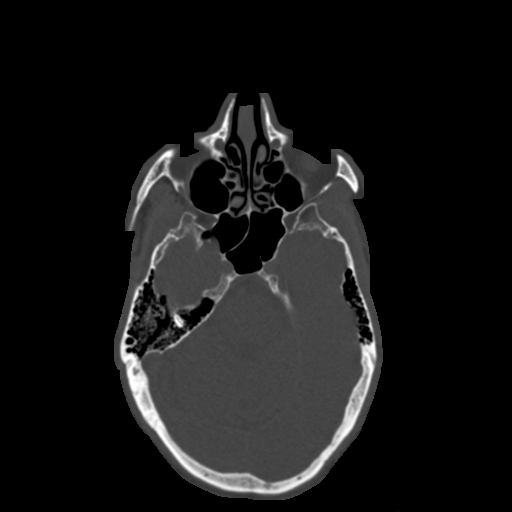
[im 97/165  bone]
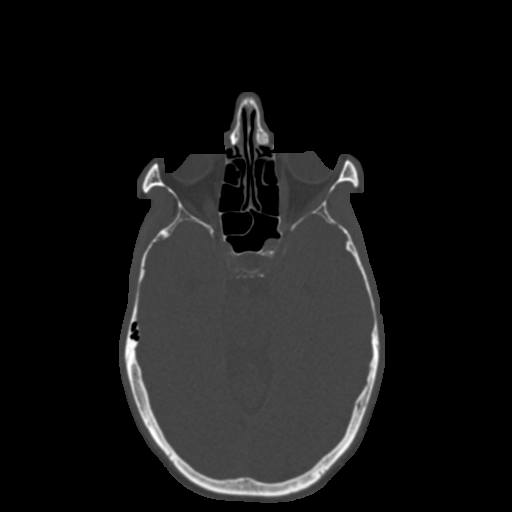
[im 108/165  bone]
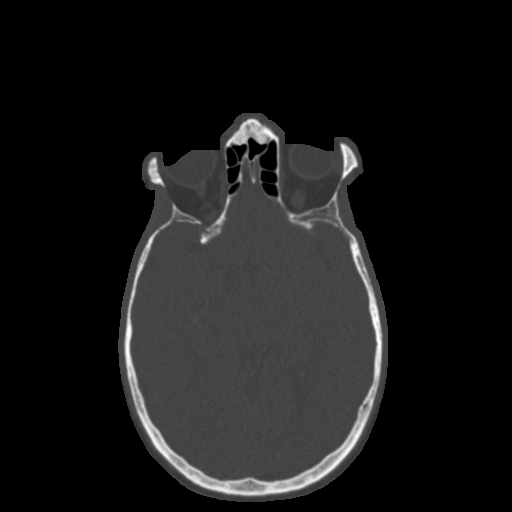
[im 119/165  bone]
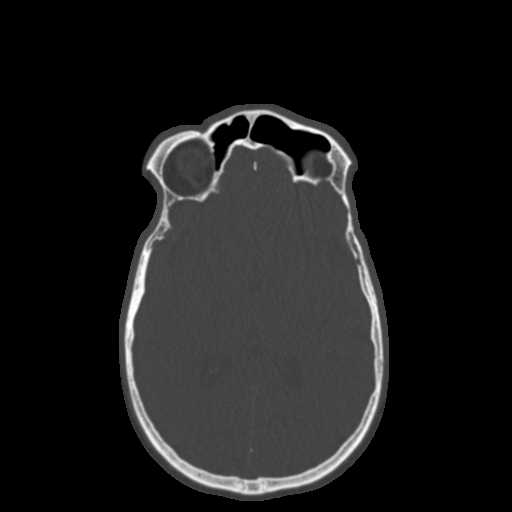
[im 125/165  brain]
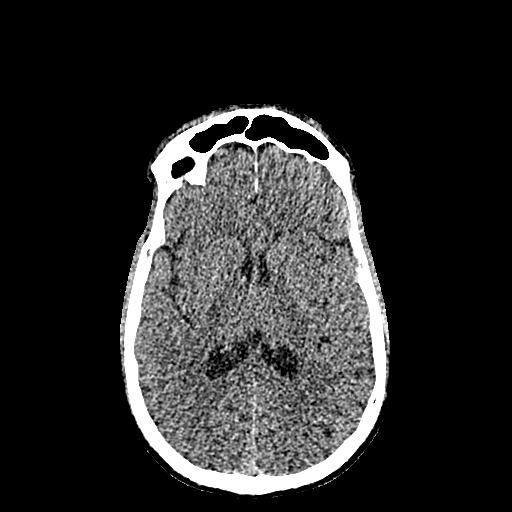
[im 125/165  bone]
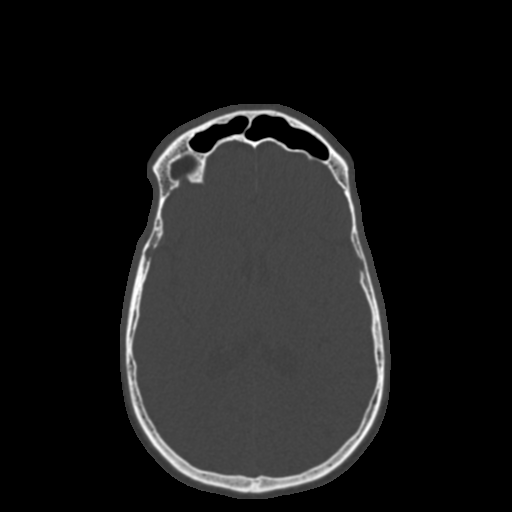
[im 136/165  bone]
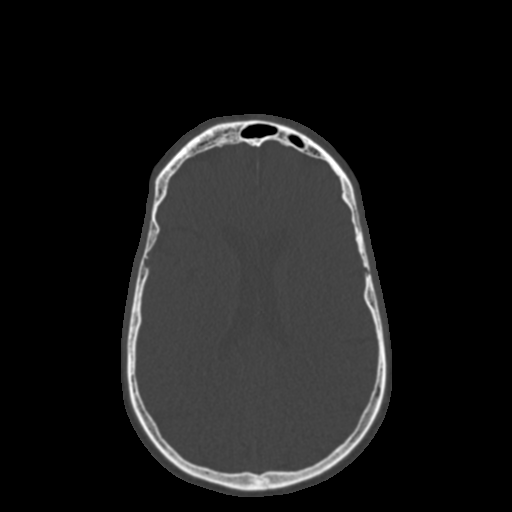
[im 148/165  bone]
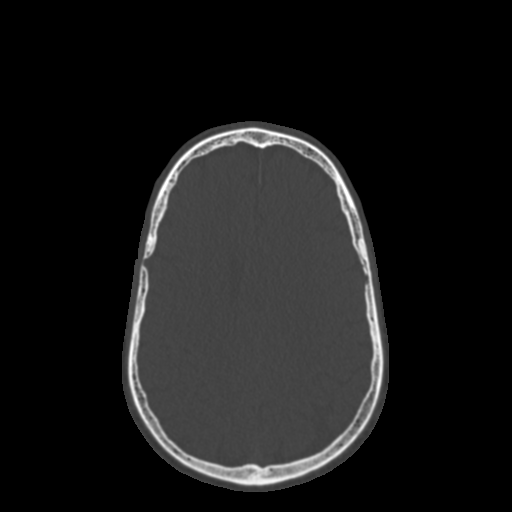
[im 159/165  bone]
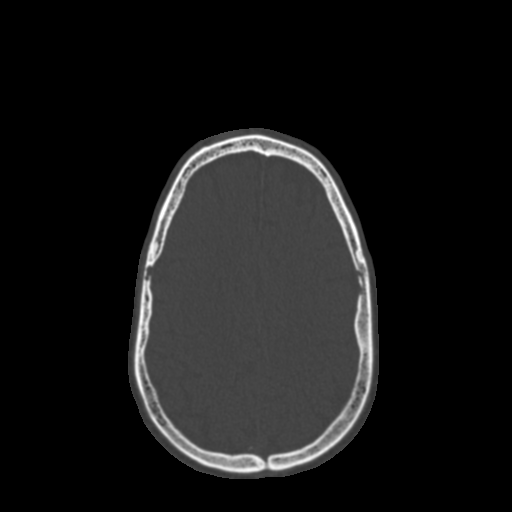

[16 of 30 positions shown; findings below may reference images not displayed]

FINDINGS: Paranasal sinuses:

Frontal: Normally aerated. Patent frontal sinus drainage pathways.

Ethmoid: Normally aerated.

Maxillary: Normally aerated.

Mastoid sinus: Well developed and clear

Sphenoid: Normally aerated. Patent sphenoethmoidal recesses.

Right ostiomeatal unit: Patent.

Left ostiomeatal unit: Patent.

Nasal passages: Patent. Intact nasal septum is midline.

Other: Limited intracranial imaging normal. Normal orbital contents.
Hearing aid in right external auditory canal.
IMPRESSION: Normally aerated paranasal sinuses.  Patent sinus drainage pathways.

## 2020-01-29 DIAGNOSIS — J3081 Allergic rhinitis due to animal (cat) (dog) hair and dander: Secondary | ICD-10-CM | POA: Diagnosis not present

## 2020-01-29 DIAGNOSIS — J301 Allergic rhinitis due to pollen: Secondary | ICD-10-CM | POA: Diagnosis not present

## 2020-01-29 DIAGNOSIS — J3089 Other allergic rhinitis: Secondary | ICD-10-CM | POA: Diagnosis not present

## 2020-02-03 DIAGNOSIS — J3081 Allergic rhinitis due to animal (cat) (dog) hair and dander: Secondary | ICD-10-CM | POA: Diagnosis not present

## 2020-02-03 DIAGNOSIS — J3089 Other allergic rhinitis: Secondary | ICD-10-CM | POA: Diagnosis not present

## 2020-02-03 DIAGNOSIS — J301 Allergic rhinitis due to pollen: Secondary | ICD-10-CM | POA: Diagnosis not present

## 2020-02-10 DIAGNOSIS — J3081 Allergic rhinitis due to animal (cat) (dog) hair and dander: Secondary | ICD-10-CM | POA: Diagnosis not present

## 2020-02-10 DIAGNOSIS — J301 Allergic rhinitis due to pollen: Secondary | ICD-10-CM | POA: Diagnosis not present

## 2020-02-10 DIAGNOSIS — J3089 Other allergic rhinitis: Secondary | ICD-10-CM | POA: Diagnosis not present

## 2020-02-24 DIAGNOSIS — J3089 Other allergic rhinitis: Secondary | ICD-10-CM | POA: Diagnosis not present

## 2020-02-24 DIAGNOSIS — J301 Allergic rhinitis due to pollen: Secondary | ICD-10-CM | POA: Diagnosis not present

## 2020-02-24 DIAGNOSIS — J3081 Allergic rhinitis due to animal (cat) (dog) hair and dander: Secondary | ICD-10-CM | POA: Diagnosis not present

## 2020-03-04 DIAGNOSIS — J3089 Other allergic rhinitis: Secondary | ICD-10-CM | POA: Diagnosis not present

## 2020-03-04 DIAGNOSIS — J3081 Allergic rhinitis due to animal (cat) (dog) hair and dander: Secondary | ICD-10-CM | POA: Diagnosis not present

## 2020-03-04 DIAGNOSIS — J301 Allergic rhinitis due to pollen: Secondary | ICD-10-CM | POA: Diagnosis not present

## 2020-03-09 DIAGNOSIS — J301 Allergic rhinitis due to pollen: Secondary | ICD-10-CM | POA: Diagnosis not present

## 2020-03-09 DIAGNOSIS — J3089 Other allergic rhinitis: Secondary | ICD-10-CM | POA: Diagnosis not present

## 2020-03-09 DIAGNOSIS — J3081 Allergic rhinitis due to animal (cat) (dog) hair and dander: Secondary | ICD-10-CM | POA: Diagnosis not present

## 2020-03-16 DIAGNOSIS — J3089 Other allergic rhinitis: Secondary | ICD-10-CM | POA: Diagnosis not present

## 2020-03-16 DIAGNOSIS — J3081 Allergic rhinitis due to animal (cat) (dog) hair and dander: Secondary | ICD-10-CM | POA: Diagnosis not present

## 2020-03-16 DIAGNOSIS — J301 Allergic rhinitis due to pollen: Secondary | ICD-10-CM | POA: Diagnosis not present

## 2020-03-24 DIAGNOSIS — J3081 Allergic rhinitis due to animal (cat) (dog) hair and dander: Secondary | ICD-10-CM | POA: Diagnosis not present

## 2020-03-24 DIAGNOSIS — J3089 Other allergic rhinitis: Secondary | ICD-10-CM | POA: Diagnosis not present

## 2020-03-24 DIAGNOSIS — J301 Allergic rhinitis due to pollen: Secondary | ICD-10-CM | POA: Diagnosis not present

## 2020-03-30 DIAGNOSIS — J3081 Allergic rhinitis due to animal (cat) (dog) hair and dander: Secondary | ICD-10-CM | POA: Diagnosis not present

## 2020-03-30 DIAGNOSIS — J3089 Other allergic rhinitis: Secondary | ICD-10-CM | POA: Diagnosis not present

## 2020-03-30 DIAGNOSIS — J301 Allergic rhinitis due to pollen: Secondary | ICD-10-CM | POA: Diagnosis not present

## 2020-04-06 DIAGNOSIS — J3081 Allergic rhinitis due to animal (cat) (dog) hair and dander: Secondary | ICD-10-CM | POA: Diagnosis not present

## 2020-04-06 DIAGNOSIS — J3089 Other allergic rhinitis: Secondary | ICD-10-CM | POA: Diagnosis not present

## 2020-04-06 DIAGNOSIS — J301 Allergic rhinitis due to pollen: Secondary | ICD-10-CM | POA: Diagnosis not present

## 2020-04-13 DIAGNOSIS — J3081 Allergic rhinitis due to animal (cat) (dog) hair and dander: Secondary | ICD-10-CM | POA: Diagnosis not present

## 2020-04-13 DIAGNOSIS — J3089 Other allergic rhinitis: Secondary | ICD-10-CM | POA: Diagnosis not present

## 2020-04-13 DIAGNOSIS — J301 Allergic rhinitis due to pollen: Secondary | ICD-10-CM | POA: Diagnosis not present

## 2020-04-21 DIAGNOSIS — J3089 Other allergic rhinitis: Secondary | ICD-10-CM | POA: Diagnosis not present

## 2020-04-21 DIAGNOSIS — J301 Allergic rhinitis due to pollen: Secondary | ICD-10-CM | POA: Diagnosis not present

## 2020-04-21 DIAGNOSIS — J3081 Allergic rhinitis due to animal (cat) (dog) hair and dander: Secondary | ICD-10-CM | POA: Diagnosis not present

## 2020-04-27 DIAGNOSIS — J301 Allergic rhinitis due to pollen: Secondary | ICD-10-CM | POA: Diagnosis not present

## 2020-04-27 DIAGNOSIS — J3089 Other allergic rhinitis: Secondary | ICD-10-CM | POA: Diagnosis not present

## 2020-04-27 DIAGNOSIS — J3081 Allergic rhinitis due to animal (cat) (dog) hair and dander: Secondary | ICD-10-CM | POA: Diagnosis not present

## 2020-05-04 DIAGNOSIS — J301 Allergic rhinitis due to pollen: Secondary | ICD-10-CM | POA: Diagnosis not present

## 2020-05-04 DIAGNOSIS — J3081 Allergic rhinitis due to animal (cat) (dog) hair and dander: Secondary | ICD-10-CM | POA: Diagnosis not present

## 2020-05-04 DIAGNOSIS — J3089 Other allergic rhinitis: Secondary | ICD-10-CM | POA: Diagnosis not present

## 2020-05-08 DIAGNOSIS — Z23 Encounter for immunization: Secondary | ICD-10-CM | POA: Diagnosis not present

## 2020-05-11 DIAGNOSIS — J301 Allergic rhinitis due to pollen: Secondary | ICD-10-CM | POA: Diagnosis not present

## 2020-05-11 DIAGNOSIS — J3081 Allergic rhinitis due to animal (cat) (dog) hair and dander: Secondary | ICD-10-CM | POA: Diagnosis not present

## 2020-05-11 DIAGNOSIS — J3089 Other allergic rhinitis: Secondary | ICD-10-CM | POA: Diagnosis not present

## 2020-05-14 DIAGNOSIS — J3081 Allergic rhinitis due to animal (cat) (dog) hair and dander: Secondary | ICD-10-CM | POA: Diagnosis not present

## 2020-05-14 DIAGNOSIS — J3089 Other allergic rhinitis: Secondary | ICD-10-CM | POA: Diagnosis not present

## 2020-05-14 DIAGNOSIS — J301 Allergic rhinitis due to pollen: Secondary | ICD-10-CM | POA: Diagnosis not present

## 2020-05-20 DIAGNOSIS — J3089 Other allergic rhinitis: Secondary | ICD-10-CM | POA: Diagnosis not present

## 2020-05-20 DIAGNOSIS — J3081 Allergic rhinitis due to animal (cat) (dog) hair and dander: Secondary | ICD-10-CM | POA: Diagnosis not present

## 2020-05-20 DIAGNOSIS — J301 Allergic rhinitis due to pollen: Secondary | ICD-10-CM | POA: Diagnosis not present

## 2020-05-25 DIAGNOSIS — J3081 Allergic rhinitis due to animal (cat) (dog) hair and dander: Secondary | ICD-10-CM | POA: Diagnosis not present

## 2020-05-25 DIAGNOSIS — J301 Allergic rhinitis due to pollen: Secondary | ICD-10-CM | POA: Diagnosis not present

## 2020-05-25 DIAGNOSIS — J3089 Other allergic rhinitis: Secondary | ICD-10-CM | POA: Diagnosis not present

## 2020-06-03 DIAGNOSIS — J3081 Allergic rhinitis due to animal (cat) (dog) hair and dander: Secondary | ICD-10-CM | POA: Diagnosis not present

## 2020-06-03 DIAGNOSIS — J3089 Other allergic rhinitis: Secondary | ICD-10-CM | POA: Diagnosis not present

## 2020-06-03 DIAGNOSIS — J301 Allergic rhinitis due to pollen: Secondary | ICD-10-CM | POA: Diagnosis not present

## 2020-06-09 DIAGNOSIS — J301 Allergic rhinitis due to pollen: Secondary | ICD-10-CM | POA: Diagnosis not present

## 2020-06-09 DIAGNOSIS — J3089 Other allergic rhinitis: Secondary | ICD-10-CM | POA: Diagnosis not present

## 2020-06-09 DIAGNOSIS — J3081 Allergic rhinitis due to animal (cat) (dog) hair and dander: Secondary | ICD-10-CM | POA: Diagnosis not present

## 2020-06-16 DIAGNOSIS — J301 Allergic rhinitis due to pollen: Secondary | ICD-10-CM | POA: Diagnosis not present

## 2020-06-16 DIAGNOSIS — J3081 Allergic rhinitis due to animal (cat) (dog) hair and dander: Secondary | ICD-10-CM | POA: Diagnosis not present

## 2020-06-16 DIAGNOSIS — J3089 Other allergic rhinitis: Secondary | ICD-10-CM | POA: Diagnosis not present

## 2020-06-25 DIAGNOSIS — J3089 Other allergic rhinitis: Secondary | ICD-10-CM | POA: Diagnosis not present

## 2020-06-25 DIAGNOSIS — J301 Allergic rhinitis due to pollen: Secondary | ICD-10-CM | POA: Diagnosis not present

## 2020-06-25 DIAGNOSIS — J3081 Allergic rhinitis due to animal (cat) (dog) hair and dander: Secondary | ICD-10-CM | POA: Diagnosis not present

## 2020-06-29 DIAGNOSIS — J300 Vasomotor rhinitis: Secondary | ICD-10-CM | POA: Diagnosis not present

## 2020-06-29 DIAGNOSIS — J3089 Other allergic rhinitis: Secondary | ICD-10-CM | POA: Diagnosis not present

## 2020-06-29 DIAGNOSIS — J3081 Allergic rhinitis due to animal (cat) (dog) hair and dander: Secondary | ICD-10-CM | POA: Diagnosis not present

## 2020-06-29 DIAGNOSIS — J301 Allergic rhinitis due to pollen: Secondary | ICD-10-CM | POA: Diagnosis not present

## 2020-07-06 DIAGNOSIS — J3081 Allergic rhinitis due to animal (cat) (dog) hair and dander: Secondary | ICD-10-CM | POA: Diagnosis not present

## 2020-07-06 DIAGNOSIS — J3089 Other allergic rhinitis: Secondary | ICD-10-CM | POA: Diagnosis not present

## 2020-07-06 DIAGNOSIS — J301 Allergic rhinitis due to pollen: Secondary | ICD-10-CM | POA: Diagnosis not present

## 2020-07-13 DIAGNOSIS — J301 Allergic rhinitis due to pollen: Secondary | ICD-10-CM | POA: Diagnosis not present

## 2020-07-13 DIAGNOSIS — J3089 Other allergic rhinitis: Secondary | ICD-10-CM | POA: Diagnosis not present

## 2020-07-13 DIAGNOSIS — J3081 Allergic rhinitis due to animal (cat) (dog) hair and dander: Secondary | ICD-10-CM | POA: Diagnosis not present

## 2022-05-04 ENCOUNTER — Encounter: Payer: Self-pay | Admitting: Physical Therapy

## 2022-05-04 ENCOUNTER — Other Ambulatory Visit: Payer: Self-pay

## 2022-05-04 ENCOUNTER — Ambulatory Visit: Payer: Medicare Other | Attending: Neurological Surgery | Admitting: Physical Therapy

## 2022-05-04 DIAGNOSIS — M5416 Radiculopathy, lumbar region: Secondary | ICD-10-CM | POA: Insufficient documentation

## 2022-05-04 DIAGNOSIS — M6281 Muscle weakness (generalized): Secondary | ICD-10-CM | POA: Diagnosis not present

## 2022-05-04 NOTE — Therapy (Signed)
OUTPATIENT PHYSICAL THERAPY THORACOLUMBAR EVALUATION   Patient Name: Kenneth Cruz MRN: [id number] DOB:05-Aug-1950, 71 y.o., male Today's Date: 05/04/2022   PT End of Session - 05/04/22 1359     Visit Number 1    Date for PT Re-Evaluation 06/29/22    Authorization Type Medicare 10th visit prog note    PT Start Time 1400    PT Stop Time 1440    PT Time Calculation (min) 40 min    Activity Tolerance Patient tolerated treatment well             Past Medical History:  Diagnosis Date   Crohn's disease ([facility])    Ulcerative colitis    Past Surgical History:  Procedure Laterality Date   arthroscopic knee     Patient Active Problem List   Diagnosis Date Noted   TINEA PEDIS 09/12/2007   TENOSYNOVITIS OF FOOT AND ANKLE 09/12/2007   CHONDROMALACIA PATELLA, BILATERAL 06/26/2007   BURSITIS, KNEE 06/05/2007   HALLUX VALGUS, ACQUIRED 06/05/2007   SPRAIN AND STRAIN OF METATARSAOPHALANGEAL 06/05/2007    PCP: Pearson Grippe MD  REFERRING PROVIDER: Marikay Alar MD  REFERRING DIAG: post op lumbar radiculopathy M54.16  Rationale for Evaluation and Treatment Rehabilitation  THERAPY DIAG:  Post op lumbar surgery for radiculopathy ONSET DATE: Jul 18, 2021  SUBJECTIVE:                                                                                                                                                                                           SUBJECTIVE STATEMENT: Back issues since age 34; discontinued running but started other ex's and I'm a very active person;   started paddleboarding 1 year ago, had right low back pain.  Flip turns while swimming right LBP; July 4th driving home from the beach increased low back pain;   Within 1 week tried prone press up and that was the final break;   Began to have right LE groin, posterior, knee, lateral lower leg constant. Surgery 8/25 with good pain relief but right knee muscles feel very weak and unstable especially with descending  steps.  "I tend to overdo it so I need some guidance."  PERTINENT HISTORY:  August 25th, 2023 right L4-5 microdiskectomy with removal of superior free fragment under the axilla of the L4 nerve root.;  MD follow up in mid November;  history of left LE radiculopathy at age 41 no surgery, never full recovered;  Can swim 1 mile, Elliptical, light weights Former marathon runner; History of right groin injury; bil knee scopes; left foot hardware    PAIN:  Are you having pain? Yes NPRS scale: 1/10  back pain but feels better than it has in a very long time Pain location: weakness (knee will collapse with descending stairs)  Aggravating factors: nothing particular;  getting new paddleboard. Concerned about putting on car and standing on the board Relieving factors: side sleeper  I can stand and sit longer than I could before surgery.    PRECAUTIONS: Other: per patient doctor said to lift half the weight as he previously did not lifting limit  WEIGHT BEARING RESTRICTIONS: No  FALLS:  Has patient fallen in last 6 months? No  LIVING ENVIRONMENT: Lives with: lives with their family Lives in: House/apartment OCCUPATION: retired  PLOF: Independent  PATIENT GOALS: core strength; learn technique bend/lifting; ex routine   OBJECTIVE:   DIAGNOSTIC FINDINGS:  Prior to surgery  PATIENT SURVEYS:  FOTO 61%  COGNITION:  Overall cognitive status: Within functional limits for tasks assessed     MUSCLE LENGTH: Hamstrings: Right 60 deg; Left 60 deg Hip flexor lengths WFLS POSTURE:  scoliosis with shoulder height asymmetry ; left gastroc atrophy   LUMBAR ROM:   AROM eval  Flexion   Extension 10 fearful but no painful  Right lateral flexion 25  Left lateral flexion 25  Right rotation   Left rotation    (Blank rows = not tested)  LOWER EXTREMITY ROM:   WFLS  LOWER EXTREMITY MMT:  able to rise from the chair without UE use with ease;  SLS on right with compensatory pelvic drop and  trunk lean; bil HS and quads prone to muscle cramps  MMT Right eval Left eval  Hip flexion 5 5  Hip extension 4- 4+  Hip abduction 4- 4+  Hip adduction    Hip internal rotation    Hip external rotation    Knee flexion 4 4  Knee extension 4 5  Ankle dorsiflexion    Ankle plantarflexion    Ankle inversion    Ankle eversion     (Blank rows = not tested) TRUNK STRENGTH:  Decreased activation of transverse abdominus muscles; abdominals 4-/5; decreased activation of lumbar multifidi; trunk extensors 4-/5  LUMBAR SPECIAL TESTS:  Slump test: Negative but produced right quad spasm   GAIT:  Comments: WNLs    TODAY'S TREATMENT:  [facility] Adult PT Treatment:                                                                                                                            DATE: 05/04/22 Initial HEP    PATIENT EDUCATION:  Education details: plan of care; initial HEP Person educated: Patient Education method: Explanation, Demonstration, and Handouts Education comprehension: verbalized understanding   HOME EXERCISE PROGRAM: Access Code: [email or web address] URL: [email or web address] Date: 05/04/2022 Prepared by: Lavinia Sharps  Exercises - Bird Dog  - 1 x daily - 7 x weekly - 1 sets - 10 reps - Sidelying Hip Abduction on Wall  - 1 x daily - 7 x weekly - 1 sets - 10 reps - Supine Bent  Leg Lift with bent knee lower  - 1 x daily - 7 x weekly - 1 sets - 10 reps - Step Up  - 1 x daily - 7 x weekly - 3 sets - 10 reps  ASSESSMENT:  CLINICAL IMPRESSION: Patient is a 71 y.o. male who was seen today for physical therapy evaluation and treatment for s/p right L4-5 microdiskectomy and free fragment removal on 03/11/22 (7 1/2 weeks ago).  The patient has had a long history of back pain but the new onset of right radicular symptoms a few months ago.  He had left LE radiculopathy in the remote past with improved without surgery.  He reports following surgery he has less back pain than  he has had in years although he feels his right LE is weak particularly with descending stairs.  He has enjoyed a very active lifestyle for many years and would like to be instructed in an exercise routine to return to paddleboarding and other recreational activities.  The patient would benefit from PT to address strength asymmetries transverse abdominals, lumbar multifidi, glute maximus and medius and quad muscles.  OBJECTIVE IMPAIRMENTS: decreased activity tolerance, decreased strength, increased muscle spasms, and pain.   ACTIVITY LIMITATIONS: lifting, bending, and stairs  PARTICIPATION LIMITATIONS: yard work and recreational activities  PERSONAL FACTORS: Time since onset of injury/illness/exacerbation are also affecting patient's functional outcome.   REHAB POTENTIAL: Good  CLINICAL DECISION MAKING: Stable/uncomplicated  EVALUATION COMPLEXITY: Low   GOALS: Goals reviewed with patient? Yes  SHORT TERM GOALS: Target date: 06/01/2022  The patient will demonstrate knowledge of basic self care strategies and exercises to promote healing   Baseline: Goal status: INITIAL  2.  Patient will demonstrate knowledge of basic and intermediate challenge level lumbo/pelvic/hip strengthening ex's Baseline:  Goal status: INITIAL  3.  The patient will have improved right hip and knee strength to at least 4/5 needed for hiking and descending stairs at home and in the community  Baseline:  Goal status: INITIAL  4.  The patient will demonstrate good hip hinge technique for lifting and bending for ADLS Baseline:  Goal status: INITIAL   LONG TERM GOALS: Target date: 06/29/2022  The patient will be independent in a safe self progression of a home exercise program to promote further recovery of function   Baseline:  Goal status: INITIAL  2.  The patient will report low levels of pain with 75% of ADLs and recreational activities Baseline:  Goal status: INITIAL  3.  The patient will have  lumbo/pelvic/hip core strength to grossly 5/5 needed for loading/unloading his paddleboard Baseline:  Goal status: INITIAL  4.  The patient will have improved hip and strength to at least 4+/5 needed for stability descending stairs at home and in the community  Baseline:  Goal status: INITIAL  5.  The patient will have improved FOTO score to    69%   indicating improved function with less pain  Baseline:  Goal status: INITIAL   PLAN: PT FREQUENCY: 1x/week  PT DURATION: 8 weeks  PLANNED INTERVENTIONS: Therapeutic exercises, Therapeutic activity, Neuromuscular re-education, Patient/Family education, Self Care, Joint mobilization, Aquatic Therapy, Dry Needling, Electrical stimulation, Cryotherapy, Moist heat, Taping, Traction, Ultrasound, Manual therapy, and Re-evaluation.  PLAN FOR NEXT SESSION: post-surgical lumbar microdiskectomy L4-5 rehab; teach hip hinge technique;  review and progress initial HEP particularly core strengthening; start neural gliding; right quad and right glute medius strengthening; core progression kneeling and standing to prepare for future return to paddleboarding; lifting progression for loading/unloading  paddleboard  Lavinia Sharps, PT 05/04/22 4:12 PM Phone: [phone] Fax: [phone]

## 2022-05-11 ENCOUNTER — Ambulatory Visit: Payer: Medicare Other

## 2022-05-12 ENCOUNTER — Encounter: Payer: Self-pay | Admitting: Rehabilitative and Restorative Service Providers"

## 2022-05-12 ENCOUNTER — Ambulatory Visit: Payer: Medicare Other | Admitting: Rehabilitative and Restorative Service Providers"

## 2022-05-12 DIAGNOSIS — M6281 Muscle weakness (generalized): Secondary | ICD-10-CM

## 2022-05-12 DIAGNOSIS — M5416 Radiculopathy, lumbar region: Secondary | ICD-10-CM

## 2022-05-12 NOTE — Therapy (Signed)
OUTPATIENT PHYSICAL THERAPY TREATMENT NOTE   Patient Name: Kenneth Cruz MRN: [id number] DOB:[birth year], 71 y.o., male Today's Date: 05/12/2022   PT End of Session - 05/12/22 1150     Visit Number 2    Date for PT Re-Evaluation 06/29/22    Authorization Type Medicare 10th visit prog note    Progress Note Due on Visit 10    PT Start Time 1147    PT Stop Time 1225    PT Time Calculation (min) 38 min    Activity Tolerance Patient tolerated treatment well    Behavior During Therapy [facility] for tasks assessed/performed             Past Medical History:  Diagnosis Date   Crohn's disease ([place])    Ulcerative colitis    Past Surgical History:  Procedure Laterality Date   arthroscopic knee     Patient Active Problem List   Diagnosis Date Noted   TINEA PEDIS 09/12/2007   TENOSYNOVITIS OF FOOT AND ANKLE 09/12/2007   CHONDROMALACIA PATELLA, BILATERAL 06/26/2007   BURSITIS, KNEE 06/05/2007   HALLUX VALGUS, ACQUIRED 06/05/2007   SPRAIN AND STRAIN OF METATARSAOPHALANGEAL 06/05/2007    PCP: Jani Gravel MD  REFERRING PROVIDER: Sherley Bounds MD  REFERRING DIAG: post op lumbar radiculopathy M54.16 on 03/11/22  Rationale for Evaluation and Treatment Rehabilitation  THERAPY DIAG:  Post op lumbar surgery for radiculopathy  ONSET DATE: Jul 18, 2021  SUBJECTIVE:                                                                                                                                                                                           SUBJECTIVE STATEMENT: Pt reports that he has already exercised this morning with the elliptical and has been swimming yesterday.  PERTINENT HISTORY:  August 25th, 2023 right L4-5 microdiskectomy with removal of superior free fragment under the axilla of the L4 nerve root.;  MD follow up in mid November;  history of left LE radiculopathy at age 17 no surgery, never full recovered;  Can swim 1 mile, Elliptical, light weights Former marathon  runner; History of right groin injury; bil knee scopes; left foot hardware    PAIN:  Are you having pain? Yes NPRS scale: 1/10 back pain but feels better than it has in a very long time Pain location: weakness (knee will collapse with descending stairs)  Aggravating factors: nothing particular;  getting new paddleboard. Concerned about putting on car and standing on the board Relieving factors: side sleeper  I can stand and sit longer than I could before surgery.    PRECAUTIONS: Other: per patient doctor said to  lift half the weight as he previously did not lifting limit  WEIGHT BEARING RESTRICTIONS: No  FALLS:  Has patient fallen in last 6 months? No  LIVING ENVIRONMENT: Lives with: lives with their family Lives in: House/apartment OCCUPATION: retired  PLOF: Independent  PATIENT GOALS: core strength; learn technique bend/lifting; ex routine   OBJECTIVE:   DIAGNOSTIC FINDINGS:  Prior to surgery  PATIENT SURVEYS:  FOTO 61%  COGNITION:  Overall cognitive status: Within functional limits for tasks assessed     MUSCLE LENGTH: Hamstrings: Right 60 deg; Left 60 deg Hip flexor lengths WFLS  POSTURE:  scoliosis with shoulder height asymmetry ; left gastroc atrophy   LUMBAR ROM:   AROM eval  Flexion   Extension 10 fearful but no painful  Right lateral flexion 25  Left lateral flexion 25  Right rotation   Left rotation    (Blank rows = not tested)  LOWER EXTREMITY ROM:   WFLS  LOWER EXTREMITY MMT:  able to rise from the chair without UE use with ease;  SLS on right with compensatory pelvic drop and trunk lean; bil HS and quads prone to muscle cramps  MMT Right eval Left eval  Hip flexion 5 5  Hip extension 4- 4+  Hip abduction 4- 4+  Hip adduction    Hip internal rotation    Hip external rotation    Knee flexion 4 4  Knee extension 4 5  Ankle dorsiflexion    Ankle plantarflexion    Ankle inversion    Ankle eversion     (Blank rows = not  tested)  TRUNK STRENGTH:  Decreased activation of transverse abdominus muscles; abdominals 4-/5; decreased activation of lumbar multifidi; trunk extensors 4-/5  LUMBAR SPECIAL TESTS:  Slump test: Negative but produced right quad spasm   GAIT:  Comments: WNLs    TODAY'S TREATMENT:   Date 05/12/2022: Seated blue pball rollout with cuing for maintaining straight back posture (for a hip hinge motion) 3x10 sec Seated piriformis and hamstring stretch 2x20 sec bilat Supine butterfly stretch 2x20 sec Supine posterior pelvic tilt (PPT) 2x10 Supine PPT with march 2x10 Dying bug 2x10 (with cuing to increase core engagement) Dying bug with red pball x10 (pt reported that he found a better core engagement with use of ball) Bird Dog 2x10 FWD and lateral step ups on bosu x10 bilat Standing on flat side of bosu ball performing gentle ankle pumps and side to side sway x1 min   DATE: 05/04/22 Initial HEP    PATIENT EDUCATION:  Education details: plan of care; initial HEP Person educated: Patient Education method: Explanation, Demonstration, and Handouts Education comprehension: verbalized understanding   HOME EXERCISE PROGRAM: Access Code: [email or web address] URL: [email or web address] Date: 05/04/2022 Prepared by: Lavinia Sharps  Exercises - Bird Dog  - 1 x daily - 7 x weekly - 1 sets - 10 reps - Sidelying Hip Abduction on Wall  - 1 x daily - 7 x weekly - 1 sets - 10 reps - Supine Bent Leg Lift with bent knee lower  - 1 x daily - 7 x weekly - 1 sets - 10 reps - Step Up  - 1 x daily - 7 x weekly - 3 sets - 10 reps  ASSESSMENT:  CLINICAL IMPRESSION: [name] presents to PT stating that he has already completed cardio prior to session, but is feeling some tightness over right thoracic paraspinal musculature.  Pt educated on hip hinge with blue pball rollout to maintain neutral spine.  With dying bug,  after adding red pball, pt felt that he was better able to maintain core  contraction to avoid back pain.  Pt felt good benefits with step ups on bosu ball with use of barre for balance, as needed.  Pt continues to require skilled PT to progress towards goal related activities.  OBJECTIVE IMPAIRMENTS: decreased activity tolerance, decreased strength, increased muscle spasms, and pain.   ACTIVITY LIMITATIONS: lifting, bending, and stairs  PARTICIPATION LIMITATIONS: yard work and recreational activities  PERSONAL FACTORS: Time since onset of injury/illness/exacerbation are also affecting patient's functional outcome.   REHAB POTENTIAL: Good  CLINICAL DECISION MAKING: Stable/uncomplicated  EVALUATION COMPLEXITY: Low   GOALS: Goals reviewed with patient? Yes  SHORT TERM GOALS: Target date: 06/01/2022  The patient will demonstrate knowledge of basic self care strategies and exercises to promote healing   Baseline: Goal status: IN PROGRESS  2.  Patient will demonstrate knowledge of basic and intermediate challenge level lumbo/pelvic/hip strengthening ex's Baseline:  Goal status: INITIAL  3.  The patient will have improved right hip and knee strength to at least 4/5 needed for hiking and descending stairs at home and in the community  Baseline:  Goal status: INITIAL  4.  The patient will demonstrate good hip hinge technique for lifting and bending for ADLS Baseline:  Goal status: IN PROGRESS   LONG TERM GOALS: Target date: 06/29/2022  The patient will be independent in a safe self progression of a home exercise program to promote further recovery of function   Baseline:  Goal status: INITIAL  2.  The patient will report low levels of pain with 75% of ADLs and recreational activities Baseline:  Goal status: INITIAL  3.  The patient will have lumbo/pelvic/hip core strength to grossly 5/5 needed for loading/unloading his paddleboard Baseline:  Goal status: INITIAL  4.  The patient will have improved hip and strength to at least 4+/5 needed for  stability descending stairs at home and in the community  Baseline:  Goal status: INITIAL  5.  The patient will have improved FOTO score to    69%   indicating improved function with less pain  Baseline:  Goal status: INITIAL   PLAN: PT FREQUENCY: 1x/week  PT DURATION: 8 weeks  PLANNED INTERVENTIONS: Therapeutic exercises, Therapeutic activity, Neuromuscular re-education, Patient/Family education, Self Care, Joint mobilization, Aquatic Therapy, Dry Needling, Electrical stimulation, Cryotherapy, Moist heat, Taping, Traction, Ultrasound, Manual therapy, and Re-evaluation.  PLAN FOR NEXT SESSION: post-surgical lumbar microdiskectomy L4-5 rehab; teach hip hinge technique;  review and progress initial HEP particularly core strengthening; start neural gliding; right quad and right glute medius strengthening; core progression kneeling and standing to prepare for future return to paddleboarding; lifting progression for loading/unloading paddleboard   Reather Laurence, PT 05/12/22 1:34 PM  [facility] [address] [place], [place] [address] Phone # [phone] Fax [phone]

## 2022-05-16 NOTE — Therapy (Addendum)
OUTPATIENT PHYSICAL THERAPY TREATMENT NOTE   Patient Name: Kenneth Cruz MRN: [id number] DOB:03/03/1951, 71 y.o., male Today's Date: 05/19/2022   PT End of Session - 05/19/22 1446     Visit Number 3    Date for PT Re-Evaluation 06/29/22    Authorization Type Medicare 10th visit prog note    Progress Note Due on Visit 10    PT Start Time 1445    PT Stop Time 1525    PT Time Calculation (min) 40 min    Activity Tolerance Patient tolerated treatment well    Behavior During Therapy WFL for tasks assessed/performed              Past Medical History:  Diagnosis Date   Crohn's disease (HCC)    Ulcerative colitis    Past Surgical History:  Procedure Laterality Date   arthroscopic knee     Patient Active Problem List   Diagnosis Date Noted   TINEA PEDIS 09/12/2007   TENOSYNOVITIS OF FOOT AND ANKLE 09/12/2007   CHONDROMALACIA PATELLA, BILATERAL 06/26/2007   BURSITIS, KNEE 06/05/2007   HALLUX VALGUS, ACQUIRED 06/05/2007   SPRAIN AND STRAIN OF METATARSAOPHALANGEAL 06/05/2007    PCP: Kim, James MD  REFERRING PROVIDER: Jones, David MD  REFERRING DIAG: post op lumbar radiculopathy M54.16 on 03/11/22  Rationale for Evaluation and Treatment Rehabilitation  THERAPY DIAG:  Post op lumbar surgery for radiculopathy  ONSET DATE: Jul 18, 2021  SUBJECTIVE:                                                                                                                                                                                           SUBJECTIVE STATEMENT: Pt reports being active with cardio and lower extremity strengthening prior to PT session.   PERTINENT HISTORY:  August 25th, 2023 right L4-5 microdiskectomy with removal of superior free fragment under the axilla of the L4 nerve root.;  MD follow up in mid November;  history of left LE radiculopathy at age 58 no surgery, never full recovered;  Can swim 1 mile, Elliptical, light weights Former marathon runner;  History of right groin injury; bil knee scopes; left foot hardware    PAIN:  Are you having pain? Yes NPRS scale: 1/10 back pain but feels better than it has in a very long time Pain location: weakness (knee will collapse with descending stairs)  Aggravating factors: nothing particular;  getting new paddleboard. Concerned about putting on car and standing on the board Relieving factors: side sleeper  I can stand and sit longer than I could before surgery.    PRECAUTIONS: Other: per patient doctor said to lift   half the weight as he previously did not lifting limit  WEIGHT BEARING RESTRICTIONS: No  FALLS:  Has patient fallen in last 6 months? No  LIVING ENVIRONMENT: Lives with: lives with their family Lives in: House/apartment OCCUPATION: retired  PLOF: Independent  PATIENT GOALS: core strength; learn technique bend/lifting; ex routine   OBJECTIVE:   DIAGNOSTIC FINDINGS:  Prior to surgery  PATIENT SURVEYS:  FOTO 61%  COGNITION:  Overall cognitive status: Within functional limits for tasks assessed     MUSCLE LENGTH: Hamstrings: Right 60 deg; Left 60 deg Hip flexor lengths WFLS  POSTURE:  scoliosis with shoulder height asymmetry ; left gastroc atrophy   LUMBAR ROM:   AROM eval  Flexion   Extension 10 fearful but no painful  Right lateral flexion 25  Left lateral flexion 25  Right rotation   Left rotation    (Blank rows = not tested)  LOWER EXTREMITY ROM:   WFLS  LOWER EXTREMITY MMT:  able to rise from the chair without UE use with ease;  SLS on right with compensatory pelvic drop and trunk lean; bil HS and quads prone to muscle cramps  MMT Right eval Left eval  Hip flexion 5 5  Hip extension 4- 4+  Hip abduction 4- 4+  Hip adduction    Hip internal rotation    Hip external rotation    Knee flexion 4 4  Knee extension 4 5  Ankle dorsiflexion    Ankle plantarflexion    Ankle inversion    Ankle eversion     (Blank rows = not tested)  TRUNK  STRENGTH:  Decreased activation of transverse abdominus muscles; abdominals 4-/5; decreased activation of lumbar multifidi; trunk extensors 4-/5  LUMBAR SPECIAL TESTS:  Slump test: Negative but produced right quad spasm   GAIT:  Comments: WNLs    TODAY'S TREATMENT:   05/19/2022: Seated lateral trunk stretch 2x 1 min hold, bilat  Seated green pball rollout with cuing for maintaining straight back posture (for a hip hinge motion) 3x10 sec Seated piriformis and hamstring stretch 2x20 sec bilat Supine butterfly stretch 2x20 sec Supine posterior pelvic tilt (PPT) 2x10 Supine PPT with march 2x10 Dying bug 2x10 (with cuing to increase core engagement) Bird Dog 2x10 FWD and lateral step ups on bosu x10 bilat Standing on flat side of bosu ball performing gentle ankle pumps and side to side sway x1 min  Date 05/12/2022: Seated blue pball rollout with cuing for maintaining straight back posture (for a hip hinge motion) 3x10 sec Seated piriformis and hamstring stretch 2x20 sec bilat Supine butterfly stretch 2x20 sec Supine posterior pelvic tilt (PPT) 2x10 Supine PPT with march 2x10 Dying bug 2x10 (with cuing to increase core engagement) Dying bug with red pball x10 (pt reported that he found a better core engagement with use of ball) Bird Dog 2x10 FWD and lateral step ups on bosu x10 bilat Standing on flat side of bosu ball performing gentle ankle pumps and side to side sway x1 min   DATE: 05/04/22 Initial HEP    PATIENT EDUCATION:  Education details: plan of care; initial HEP Person educated: Patient Education method: Explanation, Demonstration, and Handouts Education comprehension: verbalized understanding   HOME EXERCISE PROGRAM: Access Code: [email or web address] URL: [email or web address] Date: 05/04/2022 Prepared by: Stacy Simpson  Exercises - Bird Dog  - 1 x daily - 7 x weekly - 1 sets - 10 reps - Sidelying Hip Abduction on Wall  - 1 x daily - 7 x weekly - 1  sets -   10 reps - Supine Bent Leg Lift with bent knee lower  - 1 x daily - 7 x weekly - 1 sets - 10 reps - Step Up  - 1 x daily - 7 x weekly - 3 sets - 10 reps  ASSESSMENT:  CLINICAL IMPRESSION: [name] presents to PT stating that he has already completed cardio prior to session, but is feeling some tightness over right thoracic paraspinal musculature.  Pt educated importance of incorporating stretches into his current routine. Session continued to focus on lumbar mobility, core activation and bilat LE strengthening. He reports increased fear of movement. Plan to incorporate squats and twisting motions next session.  Pt continues to require skilled PT to progress towards goal related activities.  OBJECTIVE IMPAIRMENTS: decreased activity tolerance, decreased strength, increased muscle spasms, and pain.   ACTIVITY LIMITATIONS: lifting, bending, and stairs  PARTICIPATION LIMITATIONS: yard work and recreational activities  PERSONAL FACTORS: Time since onset of injury/illness/exacerbation are also affecting patient's functional outcome.   REHAB POTENTIAL: Good  CLINICAL DECISION MAKING: Stable/uncomplicated  EVALUATION COMPLEXITY: Low   GOALS: Goals reviewed with patient? Yes  SHORT TERM GOALS: Target date: 06/01/2022  The patient will demonstrate knowledge of basic self care strategies and exercises to promote healing   Baseline: Goal status: IN PROGRESS  2.  Patient will demonstrate knowledge of basic and intermediate challenge level lumbo/pelvic/hip strengthening ex's Baseline:  Goal status: INITIAL  3.  The patient will have improved right hip and knee strength to at least 4/5 needed for hiking and descending stairs at home and in the community  Baseline:  Goal status: INITIAL  4.  The patient will demonstrate good hip hinge technique for lifting and bending for ADLS Baseline:  Goal status: IN PROGRESS   LONG TERM GOALS: Target date: 06/29/2022  The patient will be  independent in a safe self progression of a home exercise program to promote further recovery of function   Baseline:  Goal status: INITIAL  2.  The patient will report low levels of pain with 75% of ADLs and recreational activities Baseline:  Goal status: INITIAL  3.  The patient will have lumbo/pelvic/hip core strength to grossly 5/5 needed for loading/unloading his paddleboard Baseline:  Goal status: INITIAL  4.  The patient will have improved hip and strength to at least 4+/5 needed for stability descending stairs at home and in the community  Baseline:  Goal status: INITIAL  5.  The patient will have improved FOTO score to    69%   indicating improved function with less pain  Baseline:  Goal status: INITIAL   PLAN: PT FREQUENCY: 1x/week  PT DURATION: 8 weeks  PLANNED INTERVENTIONS: Therapeutic exercises, Therapeutic activity, Neuromuscular re-education, Patient/Family education, Self Care, Joint mobilization, Aquatic Therapy, Dry Needling, Electrical stimulation, Cryotherapy, Moist heat, Taping, Traction, Ultrasound, Manual therapy, and Re-evaluation.  PLAN FOR NEXT SESSION: post-surgical lumbar microdiskectomy L4-5 rehab; teach hip hinge technique;  review and progress initial HEP particularly core strengthening; start neural gliding; right quad and right glute medius strengthening; core progression kneeling and standing to prepare for future return to paddleboarding; lifting progression for loading/unloading paddleboard     PT, DPT 05/19/22  4:22 PM    [facility] [address], [address] [place],  [address] Phone # [phone] Fax [phone]  PHYSICAL THERAPY DISCHARGE SUMMARY  Visits from Start of Care: 3  Current functional level related to goals / functional outcomes: Minimal progress due to only being seen for 3 visits.       Remaining deficits: Unable to assess due to non compliance with PT   Education /  Equipment: N/A   Patient agrees to discharge. Patient goals were not met. Patient is being discharged due to not returning since the last visit.

## 2022-05-19 ENCOUNTER — Ambulatory Visit: Payer: Medicare Other | Attending: Neurological Surgery | Admitting: Physical Therapy

## 2022-05-19 ENCOUNTER — Encounter: Payer: Self-pay | Admitting: Physical Therapy

## 2022-05-19 DIAGNOSIS — M5416 Radiculopathy, lumbar region: Secondary | ICD-10-CM | POA: Insufficient documentation

## 2022-05-19 DIAGNOSIS — M6281 Muscle weakness (generalized): Secondary | ICD-10-CM | POA: Diagnosis present

## 2022-05-24 NOTE — Therapy (Deleted)
OUTPATIENT PHYSICAL THERAPY TREATMENT NOTE   Patient Name: Kenneth Cruz MRN: [id number] DOB:[birth year], 71 y.o., male Today's Date: 05/24/2022      Past Medical History:  Diagnosis Date   Crohn's disease (HCC)    Ulcerative colitis    Past Surgical History:  Procedure Laterality Date   arthroscopic knee     Patient Active Problem List   Diagnosis Date Noted   TINEA PEDIS 09/12/2007   TENOSYNOVITIS OF FOOT AND ANKLE 09/12/2007   CHONDROMALACIA PATELLA, BILATERAL 06/26/2007   BURSITIS, KNEE 06/05/2007   HALLUX VALGUS, ACQUIRED 06/05/2007   SPRAIN AND STRAIN OF METATARSAOPHALANGEAL 06/05/2007    PCP: Pearson Grippe MD  REFERRING PROVIDER: Marikay Alar MD  REFERRING DIAG: post op lumbar radiculopathy M54.16 on 03/11/22  Rationale for Evaluation and Treatment Rehabilitation  THERAPY DIAG:  Post op lumbar surgery for radiculopathy  ONSET DATE: Jul 18, 2021  SUBJECTIVE:                                                                                                                                                                                           SUBJECTIVE STATEMENT: Pt reports being active with cardio and lower extremity strengthening prior to PT session.   PERTINENT HISTORY:  August 25th, 2023 right L4-5 microdiskectomy with removal of superior free fragment under the axilla of the L4 nerve root.;  MD follow up in mid November;  history of left LE radiculopathy at age 43 no surgery, never full recovered;  Can swim 1 mile, Elliptical, light weights Former marathon runner; History of right groin injury; bil knee scopes; left foot hardware    PAIN:  Are you having pain? Yes NPRS scale: 1/10 back pain but feels better than it has in a very long time Pain location: weakness (knee will collapse with descending stairs)  Aggravating factors: nothing particular;  getting new paddleboard. Concerned about putting on car and standing on the board Relieving factors:  side sleeper  I can stand and sit longer than I could before surgery.    PRECAUTIONS: Other: per patient doctor said to lift half the weight as he previously did not lifting limit  WEIGHT BEARING RESTRICTIONS: No  FALLS:  Has patient fallen in last 6 months? No  LIVING ENVIRONMENT: Lives with: lives with their family Lives in: House/apartment OCCUPATION: retired  PLOF: Independent  PATIENT GOALS: core strength; learn technique bend/lifting; ex routine   OBJECTIVE:   DIAGNOSTIC FINDINGS:  Prior to surgery  PATIENT SURVEYS:  FOTO 61%  COGNITION:  Overall cognitive status: Within functional limits for tasks assessed     MUSCLE LENGTH: Hamstrings: Right 60 deg; Left  60 deg Hip flexor lengths WFLS  POSTURE:  scoliosis with shoulder height asymmetry ; left gastroc atrophy   LUMBAR ROM:   AROM eval  Flexion   Extension 10 fearful but no painful  Right lateral flexion 25  Left lateral flexion 25  Right rotation   Left rotation    (Blank rows = not tested)  LOWER EXTREMITY ROM:   WFLS  LOWER EXTREMITY MMT:  able to rise from the chair without UE use with ease;  SLS on right with compensatory pelvic drop and trunk lean; bil HS and quads prone to muscle cramps  MMT Right eval Left eval  Hip flexion 5 5  Hip extension 4- 4+  Hip abduction 4- 4+  Hip adduction    Hip internal rotation    Hip external rotation    Knee flexion 4 4  Knee extension 4 5  Ankle dorsiflexion    Ankle plantarflexion    Ankle inversion    Ankle eversion     (Blank rows = not tested)  TRUNK STRENGTH:  Decreased activation of transverse abdominus muscles; abdominals 4-/5; decreased activation of lumbar multifidi; trunk extensors 4-/5  LUMBAR SPECIAL TESTS:  Slump test: Negative but produced right quad spasm   GAIT:  Comments: WNLs    TODAY'S TREATMENT:   05/19/2022: Seated lateral trunk stretch 2x 1 min hold, bilat  Seated green pball rollout with cuing for maintaining  straight back posture (for a hip hinge motion) 3x10 sec Seated piriformis and hamstring stretch 2x20 sec bilat Supine butterfly stretch 2x20 sec Supine posterior pelvic tilt (PPT) 2x10 Supine PPT with march 2x10 Dying bug 2x10 (with cuing to increase core engagement) Bird Dog 2x10 FWD and lateral step ups on bosu x10 bilat Standing on flat side of bosu ball performing gentle ankle pumps and side to side sway x1 min  Date 05/12/2022: Seated blue pball rollout with cuing for maintaining straight back posture (for a hip hinge motion) 3x10 sec Seated piriformis and hamstring stretch 2x20 sec bilat Supine butterfly stretch 2x20 sec Supine posterior pelvic tilt (PPT) 2x10 Supine PPT with march 2x10 Dying bug 2x10 (with cuing to increase core engagement) Dying bug with red pball x10 (pt reported that he found a better core engagement with use of ball) Bird Dog 2x10 FWD and lateral step ups on bosu x10 bilat Standing on flat side of bosu ball performing gentle ankle pumps and side to side sway x1 min   DATE: 05/04/22 Initial HEP    PATIENT EDUCATION:  Education details: plan of care; initial HEP Person educated: Patient Education method: Explanation, Demonstration, and Handouts Education comprehension: verbalized understanding   HOME EXERCISE PROGRAM: Access Code: [email or web address] URL: [email or web address] Date: 05/04/2022 Prepared by: Lavinia Sharps  Exercises - Bird Dog  - 1 x daily - 7 x weekly - 1 sets - 10 reps - Sidelying Hip Abduction on Wall  - 1 x daily - 7 x weekly - 1 sets - 10 reps - Supine Bent Leg Lift with bent knee lower  - 1 x daily - 7 x weekly - 1 sets - 10 reps - Step Up  - 1 x daily - 7 x weekly - 3 sets - 10 reps  ASSESSMENT:  CLINICAL IMPRESSION: Mr Steinborn presents to PT stating that he has already completed cardio prior to session, but is feeling some tightness over right thoracic paraspinal musculature.  Pt educated importance of incorporating  stretches into his current routine. Session continued to focus on lumbar mobility,  core activation and bilat LE strengthening. He reports increased fear of movement. Plan to incorporate squats and twisting motions next session.  Pt continues to require skilled PT to progress towards goal related activities.  OBJECTIVE IMPAIRMENTS: decreased activity tolerance, decreased strength, increased muscle spasms, and pain.   ACTIVITY LIMITATIONS: lifting, bending, and stairs  PARTICIPATION LIMITATIONS: yard work and recreational activities  PERSONAL FACTORS: Time since onset of injury/illness/exacerbation are also affecting patient's functional outcome.   REHAB POTENTIAL: Good  CLINICAL DECISION MAKING: Stable/uncomplicated  EVALUATION COMPLEXITY: Low   GOALS: Goals reviewed with patient? Yes  SHORT TERM GOALS: Target date: 06/01/2022  The patient will demonstrate knowledge of basic self care strategies and exercises to promote healing   Baseline: Goal status: IN PROGRESS  2.  Patient will demonstrate knowledge of basic and intermediate challenge level lumbo/pelvic/hip strengthening ex's Baseline:  Goal status: INITIAL  3.  The patient will have improved right hip and knee strength to at least 4/5 needed for hiking and descending stairs at home and in the community  Baseline:  Goal status: INITIAL  4.  The patient will demonstrate good hip hinge technique for lifting and bending for ADLS Baseline:  Goal status: IN PROGRESS   LONG TERM GOALS: Target date: 06/29/2022  The patient will be independent in a safe self progression of a home exercise program to promote further recovery of function   Baseline:  Goal status: INITIAL  2.  The patient will report low levels of pain with 75% of ADLs and recreational activities Baseline:  Goal status: INITIAL  3.  The patient will have lumbo/pelvic/hip core strength to grossly 5/5 needed for loading/unloading his paddleboard Baseline:   Goal status: INITIAL  4.  The patient will have improved hip and strength to at least 4+/5 needed for stability descending stairs at home and in the community  Baseline:  Goal status: INITIAL  5.  The patient will have improved FOTO score to    69%   indicating improved function with less pain  Baseline:  Goal status: INITIAL   PLAN: PT FREQUENCY: 1x/week  PT DURATION: 8 weeks  PLANNED INTERVENTIONS: Therapeutic exercises, Therapeutic activity, Neuromuscular re-education, Patient/Family education, Self Care, Joint mobilization, Aquatic Therapy, Dry Needling, Electrical stimulation, Cryotherapy, Moist heat, Taping, Traction, Ultrasound, Manual therapy, and Re-evaluation.  PLAN FOR NEXT SESSION: post-surgical lumbar microdiskectomy L4-5 rehab; teach hip hinge technique;  review and progress initial HEP particularly core strengthening; start neural gliding; right quad and right glute medius strengthening; core progression kneeling and standing to prepare for future return to paddleboarding; lifting progression for loading/unloading paddleboard   Rudi Heap PT, DPT 05/24/22  2:37 PM    [facility] [address] [place], [place] [address] Phone # [phone] Fax [phone]

## 2022-05-26 ENCOUNTER — Ambulatory Visit: Payer: Medicare Other | Admitting: Physical Therapy

## 2022-06-07 ENCOUNTER — Encounter: Payer: Medicare Other | Admitting: Physical Therapy

## 2022-06-21 ENCOUNTER — Encounter: Payer: Medicare Other | Admitting: Physical Therapy

## 2022-07-05 ENCOUNTER — Encounter: Payer: Medicare Other | Admitting: Physical Therapy
